# Patient Record
Sex: Female | Born: 1975 | Race: White | Hispanic: No | Marital: Married | Smoking: Current every day smoker
Health system: Southern US, Community
[De-identification: ages and names within clinical notes are randomized; demographics above are authoritative.]

## PROBLEM LIST (undated history)

## (undated) DIAGNOSIS — M542 Cervicalgia: Secondary | ICD-10-CM

## (undated) DIAGNOSIS — G8929 Other chronic pain: Secondary | ICD-10-CM

## (undated) DIAGNOSIS — F319 Bipolar disorder, unspecified: Secondary | ICD-10-CM

## (undated) DIAGNOSIS — F1120 Opioid dependence, uncomplicated: Secondary | ICD-10-CM

## (undated) DIAGNOSIS — M549 Dorsalgia, unspecified: Secondary | ICD-10-CM

## (undated) DIAGNOSIS — O24419 Gestational diabetes mellitus in pregnancy, unspecified control: Secondary | ICD-10-CM

## (undated) HISTORY — PX: TUBAL LIGATION: SHX77

## (undated) HISTORY — PX: OTHER SURGICAL HISTORY: SHX169

## (undated) HISTORY — DX: Dorsalgia, unspecified: M54.9

## (undated) HISTORY — PX: NECK SURGERY: SHX720

## (undated) HISTORY — DX: Opioid dependence, uncomplicated: F11.20

## (undated) HISTORY — DX: Bipolar disorder, unspecified: F31.9

## (undated) HISTORY — DX: Cervicalgia: M54.2

## (undated) HISTORY — DX: Gestational diabetes mellitus in pregnancy, unspecified control: O24.419

## (undated) HISTORY — DX: Other chronic pain: G89.29

---

## 1998-04-29 ENCOUNTER — Emergency Department (HOSPITAL_COMMUNITY): Admission: EM | Admit: 1998-04-29 | Discharge: 1998-04-29 | Payer: Self-pay | Admitting: Emergency Medicine

## 1998-05-19 ENCOUNTER — Other Ambulatory Visit: Admission: RE | Admit: 1998-05-19 | Discharge: 1998-05-19 | Payer: Self-pay | Admitting: Obstetrics

## 1998-06-15 ENCOUNTER — Ambulatory Visit (HOSPITAL_COMMUNITY): Admission: RE | Admit: 1998-06-15 | Discharge: 1998-06-15 | Payer: Self-pay | Admitting: Obstetrics

## 1998-10-13 ENCOUNTER — Inpatient Hospital Stay (HOSPITAL_COMMUNITY): Admission: RE | Admit: 1998-10-13 | Discharge: 1998-10-13 | Payer: Self-pay | Admitting: Obstetrics

## 1999-01-07 ENCOUNTER — Inpatient Hospital Stay (HOSPITAL_COMMUNITY): Admission: AD | Admit: 1999-01-07 | Discharge: 1999-01-09 | Payer: Self-pay | Admitting: Obstetrics

## 1999-02-19 ENCOUNTER — Encounter: Payer: Self-pay | Admitting: Emergency Medicine

## 1999-02-19 ENCOUNTER — Emergency Department (HOSPITAL_COMMUNITY): Admission: EM | Admit: 1999-02-19 | Discharge: 1999-02-19 | Payer: Self-pay | Admitting: Emergency Medicine

## 1999-04-19 ENCOUNTER — Emergency Department (HOSPITAL_COMMUNITY): Admission: EM | Admit: 1999-04-19 | Discharge: 1999-04-19 | Payer: Self-pay | Admitting: Emergency Medicine

## 1999-04-19 ENCOUNTER — Encounter: Payer: Self-pay | Admitting: Emergency Medicine

## 1999-06-02 ENCOUNTER — Encounter: Payer: Self-pay | Admitting: Orthopedic Surgery

## 1999-06-02 ENCOUNTER — Ambulatory Visit (HOSPITAL_COMMUNITY): Admission: RE | Admit: 1999-06-02 | Discharge: 1999-06-02 | Payer: Self-pay | Admitting: Orthopedic Surgery

## 1999-09-15 ENCOUNTER — Emergency Department (HOSPITAL_COMMUNITY): Admission: EM | Admit: 1999-09-15 | Discharge: 1999-09-15 | Payer: Self-pay | Admitting: Emergency Medicine

## 1999-10-07 ENCOUNTER — Encounter: Payer: Self-pay | Admitting: Emergency Medicine

## 1999-10-07 ENCOUNTER — Emergency Department (HOSPITAL_COMMUNITY): Admission: EM | Admit: 1999-10-07 | Discharge: 1999-10-07 | Payer: Self-pay | Admitting: Emergency Medicine

## 1999-12-28 ENCOUNTER — Emergency Department (HOSPITAL_COMMUNITY): Admission: EM | Admit: 1999-12-28 | Discharge: 1999-12-28 | Payer: Self-pay

## 2000-01-19 ENCOUNTER — Emergency Department (HOSPITAL_COMMUNITY): Admission: EM | Admit: 2000-01-19 | Discharge: 2000-01-19 | Payer: Self-pay | Admitting: Emergency Medicine

## 2000-01-22 ENCOUNTER — Emergency Department (HOSPITAL_COMMUNITY): Admission: EM | Admit: 2000-01-22 | Discharge: 2000-01-22 | Payer: Self-pay | Admitting: Emergency Medicine

## 2000-02-19 ENCOUNTER — Emergency Department (HOSPITAL_COMMUNITY): Admission: EM | Admit: 2000-02-19 | Discharge: 2000-02-19 | Payer: Self-pay | Admitting: Emergency Medicine

## 2000-02-28 ENCOUNTER — Emergency Department (HOSPITAL_COMMUNITY): Admission: EM | Admit: 2000-02-28 | Discharge: 2000-02-28 | Payer: Self-pay | Admitting: Emergency Medicine

## 2000-03-14 ENCOUNTER — Encounter: Payer: Self-pay | Admitting: General Practice

## 2000-03-14 ENCOUNTER — Encounter: Admission: RE | Admit: 2000-03-14 | Discharge: 2000-03-14 | Payer: Self-pay | Admitting: General Practice

## 2000-06-17 ENCOUNTER — Emergency Department (HOSPITAL_COMMUNITY): Admission: EM | Admit: 2000-06-17 | Discharge: 2000-06-17 | Payer: Self-pay | Admitting: Emergency Medicine

## 2000-08-01 ENCOUNTER — Other Ambulatory Visit: Admission: RE | Admit: 2000-08-01 | Discharge: 2000-08-01 | Payer: Self-pay | Admitting: Obstetrics

## 2000-08-10 ENCOUNTER — Inpatient Hospital Stay (HOSPITAL_COMMUNITY): Admission: AD | Admit: 2000-08-10 | Discharge: 2000-08-10 | Payer: Self-pay | Admitting: Obstetrics

## 2000-08-16 ENCOUNTER — Observation Stay (HOSPITAL_COMMUNITY): Admission: AD | Admit: 2000-08-16 | Discharge: 2000-08-17 | Payer: Self-pay | Admitting: Obstetrics

## 2000-08-16 ENCOUNTER — Encounter (INDEPENDENT_AMBULATORY_CARE_PROVIDER_SITE_OTHER): Payer: Self-pay | Admitting: Specialist

## 2000-08-17 ENCOUNTER — Encounter: Payer: Self-pay | Admitting: Obstetrics

## 2000-09-05 ENCOUNTER — Emergency Department (HOSPITAL_COMMUNITY): Admission: EM | Admit: 2000-09-05 | Discharge: 2000-09-05 | Payer: Self-pay | Admitting: Emergency Medicine

## 2000-10-30 ENCOUNTER — Encounter: Payer: Self-pay | Admitting: Internal Medicine

## 2000-10-30 ENCOUNTER — Emergency Department (HOSPITAL_COMMUNITY): Admission: EM | Admit: 2000-10-30 | Discharge: 2000-10-30 | Payer: Self-pay | Admitting: Internal Medicine

## 2000-11-18 ENCOUNTER — Emergency Department (HOSPITAL_COMMUNITY): Admission: EM | Admit: 2000-11-18 | Discharge: 2000-11-18 | Payer: Self-pay

## 2000-11-28 ENCOUNTER — Other Ambulatory Visit: Admission: RE | Admit: 2000-11-28 | Discharge: 2000-11-28 | Payer: Self-pay | Admitting: Obstetrics

## 2001-01-27 ENCOUNTER — Emergency Department (HOSPITAL_COMMUNITY): Admission: EM | Admit: 2001-01-27 | Discharge: 2001-01-27 | Payer: Self-pay | Admitting: *Deleted

## 2001-04-30 ENCOUNTER — Inpatient Hospital Stay (HOSPITAL_COMMUNITY): Admission: AD | Admit: 2001-04-30 | Discharge: 2001-04-30 | Payer: Self-pay | Admitting: Obstetrics

## 2001-06-06 ENCOUNTER — Inpatient Hospital Stay (HOSPITAL_COMMUNITY): Admission: AD | Admit: 2001-06-06 | Discharge: 2001-06-06 | Payer: Self-pay | Admitting: Obstetrics

## 2001-07-03 ENCOUNTER — Inpatient Hospital Stay (HOSPITAL_COMMUNITY): Admission: AD | Admit: 2001-07-03 | Discharge: 2001-07-03 | Payer: Self-pay | Admitting: Obstetrics

## 2001-07-09 ENCOUNTER — Inpatient Hospital Stay (HOSPITAL_COMMUNITY): Admission: AD | Admit: 2001-07-09 | Discharge: 2001-07-09 | Payer: Self-pay | Admitting: Obstetrics

## 2001-07-18 ENCOUNTER — Inpatient Hospital Stay (HOSPITAL_COMMUNITY): Admission: AD | Admit: 2001-07-18 | Discharge: 2001-07-18 | Payer: Self-pay | Admitting: Obstetrics

## 2001-07-24 ENCOUNTER — Inpatient Hospital Stay (HOSPITAL_COMMUNITY): Admission: AD | Admit: 2001-07-24 | Discharge: 2001-07-26 | Payer: Self-pay | Admitting: Obstetrics

## 2001-09-17 ENCOUNTER — Encounter: Payer: Self-pay | Admitting: *Deleted

## 2001-09-17 ENCOUNTER — Emergency Department (HOSPITAL_COMMUNITY): Admission: EM | Admit: 2001-09-17 | Discharge: 2001-09-17 | Payer: Self-pay | Admitting: *Deleted

## 2001-10-18 ENCOUNTER — Encounter: Payer: Self-pay | Admitting: Emergency Medicine

## 2001-10-18 ENCOUNTER — Emergency Department (HOSPITAL_COMMUNITY): Admission: EM | Admit: 2001-10-18 | Discharge: 2001-10-18 | Payer: Self-pay | Admitting: Emergency Medicine

## 2001-12-20 ENCOUNTER — Emergency Department (HOSPITAL_COMMUNITY): Admission: EM | Admit: 2001-12-20 | Discharge: 2001-12-20 | Payer: Self-pay | Admitting: Emergency Medicine

## 2002-02-16 ENCOUNTER — Encounter: Payer: Self-pay | Admitting: *Deleted

## 2002-02-16 ENCOUNTER — Emergency Department (HOSPITAL_COMMUNITY): Admission: EM | Admit: 2002-02-16 | Discharge: 2002-02-16 | Payer: Self-pay | Admitting: Emergency Medicine

## 2002-06-02 ENCOUNTER — Encounter: Payer: Self-pay | Admitting: Emergency Medicine

## 2002-06-02 ENCOUNTER — Emergency Department (HOSPITAL_COMMUNITY): Admission: EM | Admit: 2002-06-02 | Discharge: 2002-06-03 | Payer: Self-pay | Admitting: Emergency Medicine

## 2002-07-15 ENCOUNTER — Emergency Department (HOSPITAL_COMMUNITY): Admission: EM | Admit: 2002-07-15 | Discharge: 2002-07-15 | Payer: Self-pay | Admitting: Emergency Medicine

## 2002-07-27 ENCOUNTER — Emergency Department (HOSPITAL_COMMUNITY): Admission: EM | Admit: 2002-07-27 | Discharge: 2002-07-27 | Payer: Self-pay | Admitting: Emergency Medicine

## 2002-08-14 ENCOUNTER — Emergency Department (HOSPITAL_COMMUNITY): Admission: EM | Admit: 2002-08-14 | Discharge: 2002-08-14 | Payer: Self-pay | Admitting: Emergency Medicine

## 2002-08-16 ENCOUNTER — Emergency Department (HOSPITAL_COMMUNITY): Admission: EM | Admit: 2002-08-16 | Discharge: 2002-08-16 | Payer: Self-pay | Admitting: Emergency Medicine

## 2002-08-31 ENCOUNTER — Emergency Department (HOSPITAL_COMMUNITY): Admission: EM | Admit: 2002-08-31 | Discharge: 2002-08-31 | Payer: Self-pay | Admitting: Emergency Medicine

## 2002-08-31 ENCOUNTER — Encounter: Payer: Self-pay | Admitting: Emergency Medicine

## 2002-09-08 ENCOUNTER — Emergency Department (HOSPITAL_COMMUNITY): Admission: EM | Admit: 2002-09-08 | Discharge: 2002-09-09 | Payer: Self-pay | Admitting: Emergency Medicine

## 2002-09-12 ENCOUNTER — Encounter: Payer: Self-pay | Admitting: Emergency Medicine

## 2002-09-12 ENCOUNTER — Emergency Department (HOSPITAL_COMMUNITY): Admission: EM | Admit: 2002-09-12 | Discharge: 2002-09-12 | Payer: Self-pay | Admitting: Emergency Medicine

## 2002-09-13 ENCOUNTER — Emergency Department (HOSPITAL_COMMUNITY): Admission: EM | Admit: 2002-09-13 | Discharge: 2002-09-13 | Payer: Self-pay | Admitting: Emergency Medicine

## 2002-09-13 ENCOUNTER — Encounter: Payer: Self-pay | Admitting: Emergency Medicine

## 2003-01-07 ENCOUNTER — Encounter: Payer: Self-pay | Admitting: Emergency Medicine

## 2003-01-07 ENCOUNTER — Emergency Department (HOSPITAL_COMMUNITY): Admission: EM | Admit: 2003-01-07 | Discharge: 2003-01-07 | Payer: Self-pay | Admitting: Emergency Medicine

## 2003-03-01 ENCOUNTER — Encounter: Admission: RE | Admit: 2003-03-01 | Discharge: 2003-03-01 | Payer: Self-pay | Admitting: Internal Medicine

## 2003-03-01 ENCOUNTER — Encounter: Payer: Self-pay | Admitting: Internal Medicine

## 2003-05-29 ENCOUNTER — Emergency Department (HOSPITAL_COMMUNITY): Admission: EM | Admit: 2003-05-29 | Discharge: 2003-05-29 | Payer: Self-pay | Admitting: Emergency Medicine

## 2003-06-04 ENCOUNTER — Encounter (INDEPENDENT_AMBULATORY_CARE_PROVIDER_SITE_OTHER): Payer: Self-pay | Admitting: *Deleted

## 2003-06-04 ENCOUNTER — Ambulatory Visit (HOSPITAL_COMMUNITY): Admission: RE | Admit: 2003-06-04 | Discharge: 2003-06-04 | Payer: Self-pay | Admitting: General Surgery

## 2003-07-16 ENCOUNTER — Emergency Department (HOSPITAL_COMMUNITY): Admission: EM | Admit: 2003-07-16 | Discharge: 2003-07-17 | Payer: Self-pay | Admitting: Emergency Medicine

## 2003-07-17 ENCOUNTER — Encounter: Payer: Self-pay | Admitting: Emergency Medicine

## 2003-08-25 ENCOUNTER — Emergency Department (HOSPITAL_COMMUNITY): Admission: EM | Admit: 2003-08-25 | Discharge: 2003-08-25 | Payer: Self-pay | Admitting: Emergency Medicine

## 2003-08-25 ENCOUNTER — Encounter: Payer: Self-pay | Admitting: Emergency Medicine

## 2003-10-11 ENCOUNTER — Inpatient Hospital Stay (HOSPITAL_COMMUNITY): Admission: AD | Admit: 2003-10-11 | Discharge: 2003-10-11 | Payer: Self-pay | Admitting: Obstetrics

## 2003-10-17 ENCOUNTER — Encounter (INDEPENDENT_AMBULATORY_CARE_PROVIDER_SITE_OTHER): Payer: Self-pay | Admitting: Specialist

## 2003-10-17 ENCOUNTER — Ambulatory Visit (HOSPITAL_COMMUNITY): Admission: AD | Admit: 2003-10-17 | Discharge: 2003-10-17 | Payer: Self-pay | Admitting: Obstetrics

## 2003-11-21 ENCOUNTER — Emergency Department (HOSPITAL_COMMUNITY): Admission: EM | Admit: 2003-11-21 | Discharge: 2003-11-21 | Payer: Self-pay | Admitting: Emergency Medicine

## 2003-11-23 ENCOUNTER — Emergency Department (HOSPITAL_COMMUNITY): Admission: EM | Admit: 2003-11-23 | Discharge: 2003-11-23 | Payer: Self-pay | Admitting: Emergency Medicine

## 2003-12-10 ENCOUNTER — Emergency Department (HOSPITAL_COMMUNITY): Admission: EM | Admit: 2003-12-10 | Discharge: 2003-12-11 | Payer: Self-pay

## 2004-02-20 ENCOUNTER — Emergency Department (HOSPITAL_COMMUNITY): Admission: EM | Admit: 2004-02-20 | Discharge: 2004-02-20 | Payer: Self-pay | Admitting: Emergency Medicine

## 2004-03-29 ENCOUNTER — Emergency Department (HOSPITAL_COMMUNITY): Admission: EM | Admit: 2004-03-29 | Discharge: 2004-03-29 | Payer: Self-pay | Admitting: Emergency Medicine

## 2004-05-17 ENCOUNTER — Emergency Department (HOSPITAL_COMMUNITY): Admission: EM | Admit: 2004-05-17 | Discharge: 2004-05-17 | Payer: Self-pay | Admitting: Emergency Medicine

## 2004-07-26 ENCOUNTER — Emergency Department (HOSPITAL_COMMUNITY): Admission: EM | Admit: 2004-07-26 | Discharge: 2004-07-26 | Payer: Self-pay | Admitting: Emergency Medicine

## 2004-09-02 ENCOUNTER — Emergency Department (HOSPITAL_COMMUNITY): Admission: EM | Admit: 2004-09-02 | Discharge: 2004-09-02 | Payer: Self-pay | Admitting: Emergency Medicine

## 2004-09-10 ENCOUNTER — Emergency Department (HOSPITAL_COMMUNITY): Admission: EM | Admit: 2004-09-10 | Discharge: 2004-09-10 | Payer: Self-pay | Admitting: Emergency Medicine

## 2004-10-01 ENCOUNTER — Inpatient Hospital Stay (HOSPITAL_COMMUNITY): Admission: AD | Admit: 2004-10-01 | Discharge: 2004-10-01 | Payer: Self-pay | Admitting: Obstetrics

## 2004-10-19 ENCOUNTER — Inpatient Hospital Stay (HOSPITAL_COMMUNITY): Admission: AD | Admit: 2004-10-19 | Discharge: 2004-10-19 | Payer: Self-pay | Admitting: Obstetrics

## 2004-10-22 ENCOUNTER — Inpatient Hospital Stay (HOSPITAL_COMMUNITY): Admission: AD | Admit: 2004-10-22 | Discharge: 2004-10-22 | Payer: Self-pay | Admitting: Obstetrics

## 2005-04-19 ENCOUNTER — Inpatient Hospital Stay (HOSPITAL_COMMUNITY): Admission: AD | Admit: 2005-04-19 | Discharge: 2005-04-19 | Payer: Self-pay | Admitting: Obstetrics

## 2005-04-30 ENCOUNTER — Inpatient Hospital Stay (HOSPITAL_COMMUNITY): Admission: AD | Admit: 2005-04-30 | Discharge: 2005-05-03 | Payer: Self-pay | Admitting: Obstetrics

## 2005-05-21 ENCOUNTER — Inpatient Hospital Stay (HOSPITAL_COMMUNITY): Admission: AD | Admit: 2005-05-21 | Discharge: 2005-05-21 | Payer: Self-pay | Admitting: Obstetrics

## 2005-06-22 ENCOUNTER — Inpatient Hospital Stay (HOSPITAL_COMMUNITY): Admission: AD | Admit: 2005-06-22 | Discharge: 2005-06-24 | Payer: Self-pay | Admitting: Obstetrics

## 2005-08-15 ENCOUNTER — Emergency Department (HOSPITAL_COMMUNITY): Admission: EM | Admit: 2005-08-15 | Discharge: 2005-08-15 | Payer: Self-pay | Admitting: Emergency Medicine

## 2005-08-31 ENCOUNTER — Emergency Department (HOSPITAL_COMMUNITY): Admission: EM | Admit: 2005-08-31 | Discharge: 2005-08-31 | Payer: Self-pay | Admitting: Emergency Medicine

## 2005-11-21 ENCOUNTER — Emergency Department (HOSPITAL_COMMUNITY): Admission: EM | Admit: 2005-11-21 | Discharge: 2005-11-21 | Payer: Self-pay | Admitting: Emergency Medicine

## 2006-02-01 ENCOUNTER — Ambulatory Visit (HOSPITAL_COMMUNITY): Admission: RE | Admit: 2006-02-01 | Discharge: 2006-02-02 | Payer: Self-pay | Admitting: Neurosurgery

## 2006-04-23 ENCOUNTER — Emergency Department (HOSPITAL_COMMUNITY): Admission: EM | Admit: 2006-04-23 | Discharge: 2006-04-23 | Payer: Self-pay | Admitting: Emergency Medicine

## 2006-05-08 ENCOUNTER — Encounter: Admission: RE | Admit: 2006-05-08 | Discharge: 2006-05-08 | Payer: Self-pay | Admitting: Neurosurgery

## 2006-07-09 ENCOUNTER — Emergency Department (HOSPITAL_COMMUNITY): Admission: EM | Admit: 2006-07-09 | Discharge: 2006-07-09 | Payer: Self-pay | Admitting: Emergency Medicine

## 2006-07-21 ENCOUNTER — Emergency Department (HOSPITAL_COMMUNITY): Admission: EM | Admit: 2006-07-21 | Discharge: 2006-07-21 | Payer: Self-pay | Admitting: Emergency Medicine

## 2006-08-12 ENCOUNTER — Encounter: Admission: RE | Admit: 2006-08-12 | Discharge: 2006-08-12 | Payer: Self-pay | Admitting: Neurosurgery

## 2006-09-07 ENCOUNTER — Emergency Department (HOSPITAL_COMMUNITY): Admission: EM | Admit: 2006-09-07 | Discharge: 2006-09-07 | Payer: Self-pay | Admitting: Emergency Medicine

## 2006-09-15 ENCOUNTER — Ambulatory Visit: Payer: Self-pay | Admitting: Physical Medicine & Rehabilitation

## 2006-09-15 ENCOUNTER — Encounter
Admission: RE | Admit: 2006-09-15 | Discharge: 2006-12-14 | Payer: Self-pay | Admitting: Physical Medicine & Rehabilitation

## 2006-09-30 ENCOUNTER — Emergency Department (HOSPITAL_COMMUNITY): Admission: EM | Admit: 2006-09-30 | Discharge: 2006-09-30 | Payer: Self-pay | Admitting: Emergency Medicine

## 2006-10-17 ENCOUNTER — Ambulatory Visit: Payer: Self-pay | Admitting: Physical Medicine & Rehabilitation

## 2006-12-07 ENCOUNTER — Encounter
Admission: RE | Admit: 2006-12-07 | Discharge: 2007-03-07 | Payer: Self-pay | Admitting: Physical Medicine & Rehabilitation

## 2006-12-07 ENCOUNTER — Ambulatory Visit: Payer: Self-pay | Admitting: Physical Medicine & Rehabilitation

## 2007-01-07 ENCOUNTER — Emergency Department (HOSPITAL_COMMUNITY): Admission: EM | Admit: 2007-01-07 | Discharge: 2007-01-07 | Payer: Self-pay | Admitting: Emergency Medicine

## 2007-01-10 ENCOUNTER — Ambulatory Visit (HOSPITAL_COMMUNITY): Admission: RE | Admit: 2007-01-10 | Discharge: 2007-01-10 | Payer: Self-pay | Admitting: Neurosurgery

## 2007-02-07 ENCOUNTER — Inpatient Hospital Stay (HOSPITAL_COMMUNITY): Admission: RE | Admit: 2007-02-07 | Discharge: 2007-02-10 | Payer: Self-pay | Admitting: Neurosurgery

## 2007-07-05 ENCOUNTER — Inpatient Hospital Stay (HOSPITAL_COMMUNITY): Admission: AD | Admit: 2007-07-05 | Discharge: 2007-07-05 | Payer: Self-pay | Admitting: Obstetrics

## 2007-07-11 ENCOUNTER — Encounter (INDEPENDENT_AMBULATORY_CARE_PROVIDER_SITE_OTHER): Payer: Self-pay | Admitting: Obstetrics

## 2007-07-11 ENCOUNTER — Ambulatory Visit (HOSPITAL_COMMUNITY): Admission: AD | Admit: 2007-07-11 | Discharge: 2007-07-11 | Payer: Self-pay | Admitting: Obstetrics

## 2007-08-14 ENCOUNTER — Emergency Department (HOSPITAL_COMMUNITY): Admission: EM | Admit: 2007-08-14 | Discharge: 2007-08-14 | Payer: Self-pay | Admitting: Emergency Medicine

## 2007-09-24 ENCOUNTER — Emergency Department (HOSPITAL_COMMUNITY): Admission: EM | Admit: 2007-09-24 | Discharge: 2007-09-24 | Payer: Self-pay | Admitting: Emergency Medicine

## 2007-09-27 ENCOUNTER — Encounter
Admission: RE | Admit: 2007-09-27 | Discharge: 2007-09-28 | Payer: Self-pay | Admitting: Physical Medicine & Rehabilitation

## 2007-09-27 ENCOUNTER — Ambulatory Visit: Payer: Self-pay | Admitting: Physical Medicine & Rehabilitation

## 2007-10-06 ENCOUNTER — Emergency Department (HOSPITAL_COMMUNITY): Admission: EM | Admit: 2007-10-06 | Discharge: 2007-10-06 | Payer: Self-pay | Admitting: Emergency Medicine

## 2007-11-22 ENCOUNTER — Encounter
Admission: RE | Admit: 2007-11-22 | Discharge: 2007-11-23 | Payer: Self-pay | Admitting: Physical Medicine & Rehabilitation

## 2007-11-22 ENCOUNTER — Ambulatory Visit: Payer: Self-pay | Admitting: Physical Medicine & Rehabilitation

## 2007-12-19 ENCOUNTER — Ambulatory Visit (HOSPITAL_COMMUNITY): Admission: RE | Admit: 2007-12-19 | Discharge: 2007-12-19 | Payer: Self-pay | Admitting: Obstetrics

## 2008-02-20 ENCOUNTER — Encounter
Admission: RE | Admit: 2008-02-20 | Discharge: 2008-02-20 | Payer: Self-pay | Admitting: Physical Medicine & Rehabilitation

## 2008-03-30 ENCOUNTER — Emergency Department (HOSPITAL_COMMUNITY): Admission: EM | Admit: 2008-03-30 | Discharge: 2008-03-30 | Payer: Self-pay | Admitting: Emergency Medicine

## 2008-04-01 ENCOUNTER — Encounter: Admission: RE | Admit: 2008-04-01 | Discharge: 2008-04-01 | Payer: Self-pay | Admitting: General Practice

## 2008-04-27 ENCOUNTER — Emergency Department (HOSPITAL_COMMUNITY): Admission: EM | Admit: 2008-04-27 | Discharge: 2008-04-27 | Payer: Self-pay | Admitting: Emergency Medicine

## 2008-05-20 ENCOUNTER — Encounter
Admission: RE | Admit: 2008-05-20 | Discharge: 2008-05-21 | Payer: Self-pay | Admitting: Physical Medicine & Rehabilitation

## 2008-05-21 ENCOUNTER — Ambulatory Visit: Payer: Self-pay | Admitting: Physical Medicine & Rehabilitation

## 2008-06-28 ENCOUNTER — Emergency Department (HOSPITAL_COMMUNITY): Admission: EM | Admit: 2008-06-28 | Discharge: 2008-06-28 | Payer: Self-pay | Admitting: Emergency Medicine

## 2008-07-19 ENCOUNTER — Emergency Department (HOSPITAL_COMMUNITY): Admission: EM | Admit: 2008-07-19 | Discharge: 2008-07-19 | Payer: Self-pay | Admitting: Emergency Medicine

## 2008-09-18 ENCOUNTER — Encounter
Admission: RE | Admit: 2008-09-18 | Discharge: 2008-10-22 | Payer: Self-pay | Admitting: Physical Medicine & Rehabilitation

## 2008-10-22 ENCOUNTER — Ambulatory Visit: Payer: Self-pay | Admitting: Physical Medicine & Rehabilitation

## 2009-01-01 ENCOUNTER — Encounter
Admission: RE | Admit: 2009-01-01 | Discharge: 2009-01-05 | Payer: Self-pay | Admitting: Physical Medicine & Rehabilitation

## 2009-01-05 ENCOUNTER — Ambulatory Visit: Payer: Self-pay | Admitting: Physical Medicine & Rehabilitation

## 2009-02-07 ENCOUNTER — Emergency Department (HOSPITAL_COMMUNITY): Admission: EM | Admit: 2009-02-07 | Discharge: 2009-02-07 | Payer: Self-pay | Admitting: Emergency Medicine

## 2009-02-10 ENCOUNTER — Other Ambulatory Visit: Payer: Self-pay | Admitting: Emergency Medicine

## 2009-02-10 ENCOUNTER — Inpatient Hospital Stay (HOSPITAL_COMMUNITY): Admission: AD | Admit: 2009-02-10 | Discharge: 2009-02-11 | Payer: Self-pay | Admitting: *Deleted

## 2009-02-10 ENCOUNTER — Ambulatory Visit: Payer: Self-pay | Admitting: *Deleted

## 2009-03-02 ENCOUNTER — Emergency Department (HOSPITAL_COMMUNITY): Admission: EM | Admit: 2009-03-02 | Discharge: 2009-03-02 | Payer: Self-pay | Admitting: Emergency Medicine

## 2009-04-23 ENCOUNTER — Encounter: Payer: Self-pay | Admitting: Internal Medicine

## 2009-04-27 ENCOUNTER — Encounter: Payer: Self-pay | Admitting: Internal Medicine

## 2009-04-27 ENCOUNTER — Ambulatory Visit (HOSPITAL_COMMUNITY): Admission: RE | Admit: 2009-04-27 | Discharge: 2009-04-27 | Payer: Self-pay

## 2009-05-08 ENCOUNTER — Ambulatory Visit (HOSPITAL_COMMUNITY): Admission: RE | Admit: 2009-05-08 | Discharge: 2009-05-08 | Payer: Self-pay | Admitting: Internal Medicine

## 2009-05-08 ENCOUNTER — Encounter: Payer: Self-pay | Admitting: Internal Medicine

## 2009-05-08 ENCOUNTER — Ambulatory Visit: Payer: Self-pay | Admitting: Internal Medicine

## 2009-05-08 DIAGNOSIS — M961 Postlaminectomy syndrome, not elsewhere classified: Secondary | ICD-10-CM | POA: Insufficient documentation

## 2009-05-08 DIAGNOSIS — O99810 Abnormal glucose complicating pregnancy: Secondary | ICD-10-CM

## 2009-05-08 DIAGNOSIS — I495 Sick sinus syndrome: Secondary | ICD-10-CM

## 2009-05-08 DIAGNOSIS — F319 Bipolar disorder, unspecified: Secondary | ICD-10-CM | POA: Insufficient documentation

## 2009-05-08 DIAGNOSIS — R51 Headache: Secondary | ICD-10-CM

## 2009-05-08 DIAGNOSIS — R519 Headache, unspecified: Secondary | ICD-10-CM | POA: Insufficient documentation

## 2009-05-08 DIAGNOSIS — M549 Dorsalgia, unspecified: Secondary | ICD-10-CM | POA: Insufficient documentation

## 2009-05-08 DIAGNOSIS — R011 Cardiac murmur, unspecified: Secondary | ICD-10-CM

## 2009-05-08 DIAGNOSIS — F17200 Nicotine dependence, unspecified, uncomplicated: Secondary | ICD-10-CM | POA: Insufficient documentation

## 2009-05-08 DIAGNOSIS — F1120 Opioid dependence, uncomplicated: Secondary | ICD-10-CM

## 2009-05-08 DIAGNOSIS — M542 Cervicalgia: Secondary | ICD-10-CM

## 2009-05-08 DIAGNOSIS — D239 Other benign neoplasm of skin, unspecified: Secondary | ICD-10-CM | POA: Insufficient documentation

## 2009-05-08 LAB — CONVERTED CEMR LAB
Bilirubin Urine: NEGATIVE
Ketones, ur: NEGATIVE mg/dL
LDL Cholesterol: 59 mg/dL (ref 0–99)
Nitrite: NEGATIVE
RBC / HPF: NONE SEEN (ref <3)
Total CHOL/HDL Ratio: 2.6
Urine Glucose: NEGATIVE mg/dL
Urobilinogen, UA: 0.2 (ref 0.0–1.0)
pH: 5.5 (ref 5.0–8.0)

## 2009-05-18 ENCOUNTER — Encounter: Payer: Self-pay | Admitting: Internal Medicine

## 2009-06-05 ENCOUNTER — Emergency Department (HOSPITAL_COMMUNITY): Admission: EM | Admit: 2009-06-05 | Discharge: 2009-06-05 | Payer: Self-pay | Admitting: Emergency Medicine

## 2009-06-08 ENCOUNTER — Emergency Department (HOSPITAL_COMMUNITY): Admission: EM | Admit: 2009-06-08 | Discharge: 2009-06-08 | Payer: Self-pay | Admitting: Emergency Medicine

## 2010-11-27 ENCOUNTER — Encounter: Payer: Self-pay | Admitting: Obstetrics

## 2010-11-28 ENCOUNTER — Encounter: Payer: Self-pay | Admitting: Physical Medicine & Rehabilitation

## 2010-11-28 ENCOUNTER — Encounter: Payer: Self-pay | Admitting: Neurosurgery

## 2010-11-29 ENCOUNTER — Encounter: Payer: Self-pay | Admitting: Neurosurgery

## 2011-02-16 LAB — COMPREHENSIVE METABOLIC PANEL
BUN: 8 mg/dL (ref 6–23)
CO2: 26 mEq/L (ref 19–32)
Chloride: 107 mEq/L (ref 96–112)
Creatinine, Ser: 0.86 mg/dL (ref 0.4–1.2)
GFR calc Af Amer: >60 mL/min (ref >60)
GFR calc non Af Amer: >60 mL/min (ref >60)
Sodium: 139 mEq/L (ref 135–145)
Total Bilirubin: 0.5 mg/dL (ref 0.3–1.2)
Total Protein: 6.4 g/dL (ref 6.0–8.3)

## 2011-02-16 LAB — DIFFERENTIAL
Basophils Absolute: 0 10*3/uL (ref 0.0–0.1)
Basophils Relative: 0 % (ref 0–1)
Eosinophils Absolute: 0.1 10*3/uL (ref 0.0–0.7)
Monocytes Relative: 6 % (ref 3–12)
Neutrophils Relative %: 65 % (ref 43–77)

## 2011-02-16 LAB — CBC
HCT: 37.1 % (ref 36.0–46.0)
Hemoglobin: 12.7 g/dL (ref 12.0–15.0)
Platelets: 235 10*3/uL (ref 150–400)

## 2011-02-16 LAB — URINALYSIS, ROUTINE W REFLEX MICROSCOPIC
Glucose, UA: NEGATIVE mg/dL
Specific Gravity, Urine: 1.018 (ref 1.005–1.030)
Urobilinogen, UA: 0.2 mg/dL (ref 0.0–1.0)
pH: 7.5 (ref 5.0–8.0)

## 2011-02-16 LAB — RAPID URINE DRUG SCREEN, HOSP PERFORMED
Benzodiazepines: NOT DETECTED
Opiates: NOT DETECTED
Tetrahydrocannabinol: NOT DETECTED

## 2011-02-16 LAB — VALPROIC ACID LEVEL: Valproic Acid Lvl: <1 ug/mL — ABNORMAL LOW (ref 50.0–100.0)

## 2011-03-02 ENCOUNTER — Encounter: Payer: Self-pay | Admitting: Internal Medicine

## 2011-03-22 NOTE — Assessment & Plan Note (Signed)
Ms. Fambrough returns to clinic today for followup evaluation.  I  last saw  in this office May 21, 2008.  She was a no show, September 19, 2008.   The patient reports that she has been getting good relief with the  hydrocodone 10/325 used anywhere from 4 to 6 times per day.  There are  rare days when she has to go up to 7 per day.  She continues to care for  her 5 children at home where she is a single parent.  She does report  that she needs a refill on her hydrocodone at the end of the month, but  has a sufficient supply of Soma at this time.   MEDICATIONS:  1. Soma 350 mg daily p.r.n.  2. Hydrocodone 10/325 one tablet 6 times per day p.r.n. (4-7 per day).   REVIEW OF SYSTEMS:  Noncontributory.   PHYSICAL EXAMINATION:  GENERAL:  Well-appearing, fit adult female in  mild acute discomfort.  VITAL SIGNS:  Blood pressure is 145/91 with the pulse 63, respiratory  rate 18, and O2 saturation 99% on room air.  MUSCULOSKELETAL:  She ambulates without any assistive device.  Cervical  range of motion was decreased in all planes.  Upper extremity strength  was 4+/5 and lower extremity strength was 5-/5.   IMPRESSION:  Chronic neck/posterior scapular and right shoulder pain  after successful cervical fusion C5-C6 x2.   In the office today, we did refill the patient's hydrocodone as of  November 03, 2008.  She reports that she has a sufficient supply of Soma  at this point.  We will plan on seeing her in followup in approximately  3 months' time with refills prior to that appointment as necessary.  She  continues to get good analgesic effect without signs of diversion or  significant side effects.           ______________________________  Ellwood Dense, M.D.     DC/MedQ  D:  10/22/2008 12:15:40  T:  10/23/2008 04:53:47  Job #:  [id number]

## 2011-03-22 NOTE — Assessment & Plan Note (Signed)
[name] returns to clinic today for followup evaluation.  I had seen  her previously, first time being September 18, 2006 and then subsequently  December 08, 2006.  Both of these prior evaluations for persistent neck  and scapular pain after prior cervical fusion C5-C6 and that occurred  February 01, 2006.  When I last saw her in February of this year, we had  been treating her with oxycodone 5 mg 1 to 2 tablets p.o. b.i.d.  She  did not show for an appointment January 05, 2007.   I have received a repeat referral from Dr. Lovell Sheehan for this patient.  I  have minimal medical records that precede the patient, and these  describe CT scan being performed January 10, 2007 which showed satisfactory  fusion at C5-C6 with moderate cervical kyphosis at C4-C5, broad-based  disk protrusion at C4-C5 with small central disk protrusion at C6-C7.   January 17, 2007, the patient saw Dr. Lovell Sheehan in followup.  At that time  there was noted to be a large herniated nucleus pulposa at C5-C6.  They  discussed possible repeat surgeries for pseudoarthrosis.   January 16, 2007, the patient saw Dr. Lovell Sheehan in followup.  He reported  some splaying of the spinous process which reduces on extension, and  could be indicative of a pseudoarthrosis.   February 07, 2007, the patient underwent repeat surgery at C5-C6 for C5-C6  pseudoarthrosis.   February 20, 2007, the patient followed up with Dr. Lovell Sheehan and was now 2  weeks status post C5-C6 instrumentation and fusion with no radicular  symptoms.  At that time she was taking Percocet 10/325 one tablet q.4 h.  p.r.n.   April 13, 2007, the patient saw Dr. Lovell Sheehan at followup.  At that time she  was having a lot of pain that started a few days previously.  Her  diagnosis was cervicalgia, status post C5-C6 anterior and posterior  fusion.  He prescribed a Medrol dose pack along with Lodine 400 mg  b.i.d. p.r.n. and Norco 10/325 one tablet q.5 h. p.r.n.   The patient reports that she  still has persistent pain in her  intrascapular region along with her right posterior shoulder and her  right neck region.  She reports that she does get relief from the  hydrocodone 10/500 used 5 times per day.  She is not taking any further  Lodine at this time, but is taking Soma 350 mg 1 to 2 tablets p.o.  daily.  She has resumed tobacco, approximately 4 cigarettes per day,  after having stopped for the 2nd surgery noted above.  She continues to  care for her children, and is not working, nor is she on disability at  this point.   REVIEW OF SYSTEMS:  Noncontributory.   MEDICATIONS:  1. Soma 350 mg 1 tablet 2 to 3 times per day p.r.n.  2. Lortab 10/500 one tablet 5 times per day p.r.n.   ALLERGIES:  No known drug allergies.   PHYSICAL EXAMINATION:  A well-appearing, fit, adult female in mild acute  discomfort.  Blood pressure and vitals were not obtained in the office today.  She has 4+/5 strength throughout the bilateral upper extremities.  Cervical range of motion was decreased, especially in lateral bending  and rotation, along with flexion.  The patient has a well-healed scar on  her posterior cervical region.  Sensation was intact to light touch  throughout the bilateral upper extremities.  Lower extremity exam showed  normal bulk and  tone throughout.  Strength was 5-/5 throughout the  bilateral lower extremities.  She ambulates without any assistive  device.   IMPRESSION:  Chronic neck/posterior scapular and right shoulder pain  after successful cervical fusion, C5-C6 x2.   At the present time, we had a long discussion regarding the medications  that she is using, the fact that needs to get them only through this  office.  She is getting relief from the hydrocodone, and I have lowered  the dose down to 10/325 instead of 10/500.  She is still allowed to use  up to 5 per day.  We also refilled her Soma as of today, maximum of 2  per day.  She understands that all  medication needs to come through this  office, and that she is not allowed to self administer excessive  amounts.  She understands that she needs to call in for her refills 5  business days prior to running out.  We will plan on seeing her in  followup in approximately 2 months' time.           ______________________________  [name], M.D.     DC/MedQ  D:  09/28/2007 15:01:53  T:  09/29/2007 10:00:21  Job #:  [id number]

## 2011-03-22 NOTE — Assessment & Plan Note (Signed)
HISTORY:  [name] returns to the clinic today for followup evaluation.  I last saw her in this office on November 23, 2007, for treatment of  chronic right shoulder and scapular and neck pain after cervical fusion  at C5-C6 x2.  The patient did not keep an appointment scheduled for  February 25, 2008.  She has been using hydrocodone 10/325 approximately 6  tablets per day.  She does need a refill in the office today.  She also  has been using the Soma approximately 3 times a day and needs a refill.   REVIEW OF SYSTEMS:  Noncontributory.   PHYSICAL EXAMINATION:  GENERAL:  Well-appearing, fit, adult female in  mild acute discomfort.  VITAL SIGNS:  Blood pressure 151/82 with a pulse of 80, respiratory rate  22, and O2 saturation 98% on room air.  MUSCULOSKELETAL:  She has 4+/5 strength throughout the bilateral upper  and lower extremities.  Sensation was intact to light touch throughout.  She ambulates without any assistive device.  Cervical range of motion  was decreased in all planes.   IMPRESSION:  1. Chronic neck/posterior scapular and right shoulder pain after      successful cervical fusion at C5-C6 x2.  2. In the office today, we did send in faxes for the Soma and      hydrocodone for this patient as of today.  She will be taking the      hydrocodone approximately 6 per day and the Soma approximately 3      per day.  She is getting good relief overall and is compliant with      restrictions regarding use.  We will plan on seeing her in followup      in approximately 4 months' time with refills prior to that      appointment if necessary.           ______________________________  Ellwood Dense, M.D.     DC/MedQ  D:  05/21/2008 10:40:58  T:  05/22/2008 04:33:11  Job #:  [id number]

## 2011-03-22 NOTE — Op Note (Signed)
NAMEROLAND, PRINE                  ACCOUNT NO.:  [id number]   MEDICAL RECORD NO.:  [id number]          PATIENT TYPE:  AMB   LOCATION:  [facility]                          FACILITY:  [facility]   PHYSICIAN:  Kathreen Cosier, M.D.DATE OF BIRTH:  20-Jan-1976   DATE OF PROCEDURE:  DATE OF DISCHARGE:  07/11/2007                               OPERATIVE REPORT   PREOPERATIVE DIAGNOSIS:  Spontaneous incomplete abortion.   POSTOPERATIVE DIAGNOSIS:  Spontaneous incomplete abortion.   PROCEDURE PERFORMED:  Dilatation and evacuation.   Using MAC with the patient in the lithotomy position, the perineum and  vagina prepped and draped. Bladder emptied with straight catheter.  Bimanual exam revealed uterus 10-12 weeks size.  Speculum placed in the  vagina.  Cervix injected with 10 mL of 1% Xylocaine.  The os was noted  to be open and easily admitted a #10 suction which was used to aspirate  the uterine contents until the cavity was clean.  The patient tolerated  the procedure well and was taken to the recovery room in good condition.           ______________________________  Kathreen Cosier, M.D.     BAM/MEDQ  D:  07/11/2007  T:  07/11/2007  Job:  [id number]
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
## 2011-03-22 NOTE — Assessment & Plan Note (Signed)
[name] returns to clinic today for followup evaluation.  She reports  that she continues to get good relief from her hydrocodone use 5 times  per day, and her Soma used generally 1 to 2 times per day.  She has,  fortunately, stopped tobacco use completely at this time.  She had been  smoking 1/2 pack of cigarettes per day since age 35.  She has gained a  small amount of weight, but is proud of herself at the present time.  She has a bad cough in the office today.   MEDICATIONS:  1. Soma 350 mg 1 tablet t.i.d. p.r.n.  2. Lortab 10/325 1 tablet 5 times per day p.r.n. (4 to 6 per day).   REVIEW OF SYSTEMS:  Positive for coughing and wheezing.   PHYSICAL EXAMINATION:  A well-appearing, fit, adult female in mild acute  discomfort.  Blood pressure 157/97 with a pulse of 85, respiratory rate 18, and O2  saturation 97% on room air.  The patient has 4+/5 strength throughout the bilateral upper and lower  extremities.  Bulk and tone were normal.  Reflexes were 2+ and  symmetrical.  Sensation was intact to light touch throughout the  bilateral upper extremities.   IMPRESSION:  Chronic neck/posterior scapular and right shoulder pain  after successful cervical fusion C5-C6 x2.   In the office today, we did refill the patient's Soma along with her  hydrocodone as of the 17th of January.  We will plan on seeing her in  followup in this office in approximately 3 months' time with refills  prior to that appointment if necessary.           ______________________________  Ellwood Dense, M.D.     DC/MedQ  D:  11/23/2007 13:17:33  T:  11/23/2007 13:37:26  Job #:  [id number]

## 2011-03-22 NOTE — Discharge Summary (Signed)
[name], [name]                  ACCOUNT NO.:  [id number]   MEDICAL RECORD NO.:  [id number]          PATIENT TYPE:  IPS   LOCATION:  0307                          FACILITY:  [facility]   PHYSICIAN:  Jasmine Pang, M.D. DATE OF BIRTH:  08/06/1976   DATE OF ADMISSION:  02/10/2009  DATE OF DISCHARGE:  02/11/2009                               DISCHARGE SUMMARY   IDENTIFICATION:  This is 35 year old single white female from [place]  who was admitted on a voluntary basis.   HISTORY OF PRESENT ILLNESS:  The patient was admitted for detox.  She  stated she had been using narcotic pain medicines for 13 years.  She has  previously been in pain clinic.  She reports she discharged herself from  these.  She has recently been seeing a psychiatrist at [facility]  and taking Depakote for bipolar disorder.  She is seeking detox for  substance abuse.  She denies any suicidal or homicidal ideation and  there is no psychosis.   PAST PSYCHIATRIC HISTORY:  As indicated above, the patient is seen at  the [facility] as an outpatient.  She is on  Depakote for bipolar disorder.   SUBSTANCE ABUSE:  The patient admits to abusing opiates since 2003.  Yesterday, was her last day of use.  She has been using oxycodone,  hydrocodone, Vicodin, and Percocet.  She apparently gets these off the  street.   MEDICAL PROBLEMS:  Chronic back pain, back surgeries in 2006.   FAMILY HISTORY:  There is a history of abuse of narcotics with mother  and sister.   MEDICATIONS:  Depakote 100 mg b.i.d.   ALLERGIES:  No known drug allergies.   PHYSICAL FINDINGS:  There were no acute physical or medical problems  noted.   HOSPITAL COURSE:  Upon admission, the patient immediately stated she did  not want to stay in the hospital.  She stated she was not suicidal or  homicidal.  She stated she was not interested in continuing her  treatment for substance abuse.  Mood was somewhat irritable before  she  thought she could to go home.  She calmed down when she realized she was  going to discharge.  She had no suicidal or homicidal ideation.  No  thoughts of self-injurious behavior.  No auditory or visual  hallucinations.  No paranoia or delusions.  Thoughts were logical and  goal-directed.  Thought content, no predominant theme.  Cognitive was  grossly intact.  Insight good.  Judgment good.  Impulse control, good.  It was felt the patient was safe for discharge today and she did not  want to stay in the hospital.   DISCHARGE DIAGNOSES:  Axis I: Bipolar disorder, not otherwise specified,  opiate dependence.  Axis II: None.  Axis III: Chronic back and neck pain.  Axis IV: Moderate (problems with primary support group, other  psychosocial issues, burden of psychiatric disorder, and chemical  dependence, burden of medical problems).  Axis V: Global assessment of functioning was 60 at the time of  discharge.  GAF was 40  upon admission.  GAF highest past year 65-70.   DISCHARGE PLANS:  There was no specific activity level or dietary  restriction.   POSTHOSPITAL CARE PLANS:  The patient will go to the [facility] on  February 12, 2009 at 9 a.m. for a walk-in appointment.   DISCHARGE MEDICATIONS:  The patient is to consult with her psychiatrist  and restart the Depakote as prescribed.      Jasmine Pang, M.D.  Electronically Signed     BHS/MEDQ  D:  02/11/2009  T:  02/12/2009  Job:  [id number]

## 2011-03-22 NOTE — Op Note (Signed)
[name], [name]                  ACCOUNT NO.:  [id number]   MEDICAL RECORD NO.:  [id number]          PATIENT TYPE:  AMB   LOCATION:  [facility]                           FACILITY:  [facility]   PHYSICIAN:  Kathreen Cosier, M.D.DATE OF BIRTH:  Jun 27, 1976   DATE OF PROCEDURE:  12/19/2007  DATE OF DISCHARGE:                               OPERATIVE REPORT   PREOPERATIVE DIAGNOSIS:  Multiparity.   POSTOPERATIVE DIAGNOSIS:  Multiparity.   PROCEDURE:  Open laparoscopic tubal sterilization.   Under general anesthesia, the patient in lithotomy position, abdomen,  perineum and vagina prepped and draped, bladder emptied with a straight  catheter.  A speculum placed in the vagina.  Cervix grasped with a Hulka  tenaculum.  In the umbilicus a transverse incision made, carried down to  the fascia.  Fascia cleaned, grasped with two Kochers, fascia and the  peritoneum opened with the Mayo scissors.  Sleeve and the trocar  inserted intraperitoneally, 3 L carbon dioxide infused  intraperitoneally, visualizing scope inserted.  Uterus, tubes, ovaries  normal.  Cautery probe inserted through the sleeve of the scope.  Right  tube grasped 1 inch from the cornu and cauterized, tube cauterized in a  total of four places moving lateral from the first site of cautery.  Procedure done in a similar fashion in the other side.  Probe was  removed from the abdomen and CO2 allowed to escape from the peritoneal  cavity.  Fascia closed with one stitch of 0 Dexon, skin closed with  subcuticular stitch of 4-0 Monocryl.           ______________________________  Kathreen Cosier, M.D.     BAM/MEDQ  D:  12/19/2007  T:  12/20/2007  Job:  [id number]

## 2011-03-25 NOTE — Consult Note (Signed)
[name], [name]                  ACCOUNT NO.:  [id number]   MEDICAL RECORD NO.:  [id number]          PATIENT TYPE:  INP   LOCATION:  9141                          FACILITY:  [facility]   PHYSICIAN:  Althea Grimmer. Santogade, M.D.DATE OF BIRTH:  November 23, 1975   DATE OF CONSULTATION:  05/01/2005  DATE OF DISCHARGE:                                   CONSULTATION   Ms. Marie [name] is a 35 year old female who is 35-weeks pregnant. Presented to  [facility] yesterday with epigastric colicky-type pain which progressed  to diarrhea and with pain descending to the umbilical area. This was acute  in onset over night and the diarrhea initially was grossly bloody. When she  presented to the hospital her white blood count was 14.4 thousand and her  hemoglobin was 12.1. Her BUN was 8. With hydration her white blood count is  now 6.6 thousand, her hemoglobin 9.9, and her BUN 4. She has not had any  fever here in the hospital. Evaluation today has consisted of a negative  abdominal ultrasound, fecal occult blood test that was positive, and amylase  of 79, liver function tests that were normal. Her albumin is 2.2. She denies  any antibiotic exposure within the last few months. She has no prior history  of ulcer disease, colitis or other GI disorders other than having heart burn  with pregnancy for which she is taking Tums. No one at home is ill  currently.   PAST MEDICAL HISTORY:  Only pertinent for carpal tunnel syndrome.   CURRENT MEDICATIONS:  Before admission, prenatal vitamins. In hospital she  is on Pepcid and Flagyl.   ALLERGIES:  None reported.   FAMILY HISTORY:  Noncontributory. No known inflammatory bowel disease in the  family.   SOCIAL HISTORY:  Smokes less than a pack of cigarettes per day. Not using  alcohol.   PHYSICAL EXAMINATION:  GENERAL: She is a well-developed, adult female near  term pregnancy. In no acute distress.  VITAL SIGNS:  Afebrile, blood pressure 131/75, pulse 77 and regular.  HEENT:  Eyes are anicteric. Oropharynx unremarkable.  SKIN:  Normal without rash.  CHEST: Sounds clear.  HEART: Regular rate and rhythm.  ABDOMEN:  Reveals a gravid uterus at 35 weeks. There is mild epigastric  tenderness and moderate right lower quadrant tenderness to deep palpation.  There is no rebound or hernia. Bowel sounds are normal.  RECTAL EXAM:  Not performed.  EXTREMITIES:  Without clubbing, cyanosis, edema, or rash.   IMPRESSION:  With her colicky pain and descending GI symptoms with bloody  diarrhea which is now only yellowish without blood, I suspect that she has  had an acute bacterial gastroenteritis which is spontaneously resolving. One  stool culture is pending.   PLAN:  Since she is improving, I would not institute new therapies at  present. I would observe her, check the stool cultures and repeat a  hemoglobin assessment. Maalox and Reglan will be discontinued as they may be  aggravating her diarrhea. If her reflux worsens, switch a PPI for the  Pepcid, such as using Protonix.  One  of our partners will follow her in the  morning.       PJS/MEDQ  D:  05/01/2005  T:  05/01/2005  Job:  [id number]   cc:   Kathreen Cosier, M.D.  [address]  [place]  [place] [address]  Fax: [phone]   Ginger Carne, MD  Fax: [phone]

## 2011-03-25 NOTE — Discharge Summary (Signed)
[name], [name]                  ACCOUNT NO.:  [id number]   MEDICAL RECORD NO.:  [id number]          PATIENT TYPE:  INP   LOCATION:  3012                         FACILITY:  [facility]   PHYSICIAN:  Cristi Loron, M.D.DATE OF BIRTH:  December 05, 1975   DATE OF ADMISSION:  02/07/2007  DATE OF DISCHARGE:  02/10/2007                               DISCHARGE SUMMARY   BRIEF HISTORY:  The patient is a 35 year old white female who has  undergone a prior C5-6 anterior cervical fusion and plating.  She  developed severe arthrosis and failed medical management.  She was  worked up with a CAT scan, MRA, etc. and it was confirmed that this was  arthrosis.  I discussed the various treatment options with the patient  including a C5-6 posterior cervical fusion and instrumentation.  The  patient weighed the risks, benefits and alternatives to surgery and  decided to proceed with that operation.   For further details of this admission, please refer to typed history and  physical.   HOSPITAL COURSE:  I admitted the patient to [facility] on February 07, 2007.  On the day of admission, I performed a C5-6 posterior  instrumentation with lateral mass screws and rods, as well as a  posterolateral arthrodesis with bone morphogenic protein and vitoss bone  graft extender.  The surgery went well (for full details of this  operation, please refer to typed operative note).   POSTOPERATIVE COURSE:  The patient's postoperative course was  unremarkable.  By postop day number 3, she was afebrile.  Vital signs  stable.  She was eating well, ambulating well and requested discharge to  home.   DISCHARGE MEDICATIONS:  Patient given prescriptions for Roxicodone and  Valium, to be taken p.r.n. for pain and muscle spasms.   The patient was given written discharge instructions.   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   PROCEDURE PERFORMED:  C5-6 posterior cervical fusion and  instrumentation.      Cristi Loron, M.D.  Electronically Signed     JDJ/MEDQ  D:  03/15/2007  T:  03/15/2007  Job:  [id number]

## 2011-03-25 NOTE — Op Note (Signed)
[name], [name]                  ACCOUNT NO.:  [id number]   MEDICAL RECORD NO.:  [id number]          PATIENT TYPE:  INP   LOCATION:  5159                         FACILITY:  [facility]   PHYSICIAN:  Cristi Loron, M.D.DATE OF BIRTH:  1976/08/26   DATE OF PROCEDURE:  02/07/2007  DATE OF DISCHARGE:                               OPERATIVE REPORT   BRIEF HISTORY:  The patient is 35 year old white female who I performed  a C5-6 anterior cervical diskectomy, fusion and plating for large  herniated disk about a year ago.  The patient had good relief of her  radicular symptoms, but she had persistent neck pain.  She failed  medical management and was worked up with x-rays and a CT scan which  demonstrated the patient had developed a pseudoarthrosis at C5-6.  I  discussed various treatment options with the patient including surgery.  The patient has weighed the risks, benefits and alternatives of surgery  and decided to proceed with a C5-6 posterior cervical fusion and  instrumentation.   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   PROCEDURE:  C5-6 posterior cervical fusion; C5-6 posterior cervical  instrumentation (Medtronic titanium lateral mass polyaxial screws and  rods).   SURGEON:  Dr. Delma Officer.   ASSISTANT:  Dr. Hilda Lias.   ANESTHESIA:  General endotracheal   ESTIMATED BLOOD LOSS:  50 mL.   SPECIMENS:  None.   DRAINS:  None.   COMPLICATIONS:  None.   PROCEDURE:  The patient was brought to operating room by the anesthesia  team.  General endotracheal anesthesia was induced.  The Mayfield three-  point head rest was applied to the patient's calvarium.  The patient was  then carefully turned to the prone position on chest rolls.  The  patient's posterior cervical and suboccipital region were then shaved  and prepared with Betadine scrub and Betadine solution.  Sterile drapes  were  applied.  I then injected the area being to be incised with  Marcaine with epinephrine solution and then used the scalpel to make a  linear midline incision over the C5-6 spinous process.  I used  electrocautery to perform a subperiosteal dissection exposing spinous  process and lamina from approximately C4 down to C7.  We then brought in  intraoperative fluoroscopy to confirm our location.  I exposed the  lateral masses at C5-C6.  We used cerebellar retractor for exposure.  We  attempted to use fluoroscopy to place lateral mass screws but we could  not see adequately because of the patient's body habitus despite several  attempts.  We therefore used anatomic landmarks.  We identified the  center of the lateral mass and then used awl to create the pilot hole  and then directed the drill guide laterally and superiorly using  standard trajectories.  We then inserted a 14 mm lateral mass screw at  C5 and C6 bilaterally.  We got good bony purchase.  We then cut  appropriate length rod connected  lateral screws and fastened them in  place using the caps  which we tightened appropriately completing the  instrumentation.   We now turned our attention to arthrodesis.  We used a high-speed drill  to decorticate the remainder of the C5 and C6 facette and then we laid  bone morphogenic protein soaked collagen sponges over these decorticated  posterior lateral structures at C5-6 and Vitoss bone graft extender over  the BMP.  This completed the arthrodesis.  We obtained hemostasis using  bipolar cautery, removed the retractor and then reapproximated the  patient's cervical thoracic fascia with interrupted 0-0 Vicryl suture,  subcutaneous tissue with interrupted 2-0 Vicryl suture and the skin with  a running 2-0 nylon suture.  The wound was then coated with bacitracin  ointment.  A sterile dressing applied.  The drapes were removed.  The  patient was subsequently returned to supine position and the  Mayfield  three-point headrest was removed from calvarium.  The patient was then  extubated by anesthesia team and transported to the post anesthesia care  unit in stable condition.  All sponge, instrument and needle counts  correct at end this case.      Cristi Loron, M.D.  Electronically Signed     JDJ/MEDQ  D:  02/07/2007  T:  02/08/2007  Job:  [id number]

## 2011-03-25 NOTE — Assessment & Plan Note (Signed)
[name] returns to the clinic today for followup evaluation.  I first  and last saw her in this office September 18, 2006 for evaluation of  chronic neck and shoulder pain after a prior diskectomy and fusion March  2007.  She underwent a cervical C5-C6 anterior cervical diskectomy and  fusion with Dr. Lovell Sheehan January 30, 2006.  She had a follow up MRI scan of  her cervical spine done July 2007 that showed cervical fusion at C5-6  without spinal stenosis or foraminal stenosis.  There was reversible of  normal cervical lordosis above the fusion level.  There was minimal disc  bulge at C4-5 with no definite cord signal abnormality.  The patient  subsequently was referred to Dr. Madelon Lips, a local orthopedist, for  evaluation of the shoulder pain.  The patient and Dr. Madelon Lips did not  have a good experience together, according to the patient.  Dr. Madelon Lips  planned to do exploratory surgery after an MRI scan of the shoulder.  We  are not sure the results of that MRI scan but in any event, the patient  decided not to have any exploratory surgery.  When I saw her September 18, 2006, we had started her on oxycodone 5 mg 1 to 2 tablets p.o.  b.i.d. along with Soma b.i.d. She also was given lidocaine patches.   The patient did not come back for her appointment as scheduled secondary  to lack of Medicaid.  She is seen in the office today and is a  combination of both tearful and angry.  She reports that she sent here  for pain relief and she wants pain relief but does not want to use any  medication.  We had tried these medicines but she reports that she is  only getting minimal relief with the oxycodone and that the lidocaine  helps her with her back pain but not with her headache or shoulder pain.  She reports that she does not want any refills on medicines in the  office today.  Instead she wants a follow up MRI scan of her cervical  spine to see what the problem might be.  I have reviewed with her  the  results of the MRI scan done of her cervical spine May 08, 2006, at  [facility].  We will plan on repeating that in the office today.   MEDICATIONS:  1. Soma 350 mg 1 tablet b.i.d. p.r.n.  2. Lidocaine patch p.r.n.  3. Oxycodone 5 mg 1 to 2 tablets p.o. b.i.d. p.r.n.   REVIEW OF SYSTEMS:  Positive for weight gain.   PHYSICAL EXAMINATION:  A well-appearing fit adult female in mild acute  discomfort.  Blood pressure 130/84, the pulse 80, respiratory rate 16 and O2  saturation 97% on room air.  She complains of tenderness of her neck with warm feeling per the  patient reports.  She has 4+/5 strength throughout her bilateral upper extremities.  Cervical range of motion was decreased slightly.   IMPRESSION:  1. Chronic right neck/posterior scapular pain/base of the skull pain      after successful cervical fusion C5-6.  2. No refill on medications were given at the patient's request.      Instead we have set her up for an MRI scan of her cervical spine      with and without contrast at [facility] to be compared with      the May 08, 2006 study.  We will plan on seeing  her in followup in      approximately 1 months' time to review the results of that scan.           ______________________________  Ellwood Dense, M.D.     DC/MedQ  D:  12/08/2006 13:05:54  T:  12/08/2006 14:21:45  Job #:  [id number]

## 2011-03-25 NOTE — Op Note (Signed)
NAMEKIYANI, JERNIGAN                            ACCOUNT NO.:  [id number]   MEDICAL RECORD NO.:  [id number]                   PATIENT TYPE:  AMB   LOCATION:  SDC                                  FACILITY:  [facility]   PHYSICIAN:  Kathreen Cosier, M.D.           DATE OF BIRTH:  04-Mar-1976   DATE OF PROCEDURE:  10/17/2003  DATE OF DISCHARGE:                                 OPERATIVE REPORT   PREOPERATIVE DIAGNOSIS:  Blighted ovum.   PROCEDURE:  D&E.   DESCRIPTION OF PROCEDURE:  Using MAC, the patient in lithotomy position.  Perineum and vagina prepped and draped, bladder emptied with straight  catheter.  Bimanual exam revealed the uterus to be top-normal in size.  Weighted speculum into the vagina.  The cervix was injected at 3, 9, and 12  o'clock with a total of 10 mL of 1% Xylocaine.  Anterior lip of the cervix  grasped with a tenaculum.  The endometrial cavity was entered and sounded to  10 cm.  The cervix was noted to be open and easily admitted a #9 suction.  This was used to aspirate the uterine contents after the cavity was  curetted.  Postop, there was some bleeding from the cervix where the  tenaculum site was, and there was one suture of #1 chromic used for  hemostasis.  The patient tolerated the procedure well and taken to recovery  room in good condition.                                               Kathreen Cosier, M.D.    BAM/MEDQ  D:  10/17/2003  T:  10/18/2003  Job:  [id number]

## 2011-03-25 NOTE — Op Note (Signed)
   [name], [name]                            ACCOUNT NO.:  [id number]   MEDICAL RECORD NO.:  [id number]                   PATIENT TYPE:  OIB   LOCATION:  2899                                 FACILITY:  [facility]   PHYSICIAN:  Leonie Man, M.D.                DATE OF BIRTH:  Feb 15, 1976   DATE OF PROCEDURE:  06/04/2003  DATE OF DISCHARGE:                                 OPERATIVE REPORT   PREOPERATIVE DIAGNOSIS:  Painful exophytic lesion of the right buttock.   POSTOPERATIVE DIAGNOSIS:  Painful exophytic lesion of the right buttock.   OPERATION PERFORMED:  Excision of lesion of the right buttock.   SURGEON:  Leonie Man, M.D.   ASSISTANT:  Nurse.   ANESTHESIA:  General.   INDICATIONS FOR PROCEDURE:  The patient is a 35 year old female presenting  with a purplish-blue, extremely painful lesion of the right buttock, just  overlying the ischial tuberosity.  She comes to the operating room now for  removal after the risks and potential benefits of surgery have been fully  discussed.  All questions answered and consent obtained.   DESCRIPTION OF PROCEDURE:  Following induction of satisfactory general  anesthesia, the patient was positioned in the lithotomy position and the  buttock and perianal regions were prepped and draped to be included in a  sterile operative field.  I infiltrated the region around the lesion with  0.5% Marcaine with epinephrine and made an elliptical incision around the  lesion deepening this through the skin and subcutaneous tissue going deep to  the lesion.  The lesion was removed in its entirety and forwarded for  pathologic evaluation. Hemostasis was obtained with electrocautery.  Subcutaneous tissue was reapproximated with 3-0 Vicryl sutures.  Skin was  closed with running 4-0 Monocryl suture and then reinforced with Dermabond.  I used a sterile Tegaderm dressing over the wound.  Anesthetic was reversed  and the patient removed from the operating  room to the recovery room in  stable condition, having tolerated the procedure well.                                                 Leonie Man, M.D.    PB/MEDQ  D:  06/04/2003  T:  06/04/2003  Job:  [id number]

## 2011-03-25 NOTE — Op Note (Signed)
[name], [name]                  ACCOUNT NO.:  [id number]   MEDICAL RECORD NO.:  [id number]          PATIENT TYPE:  INP   LOCATION:  3019                         FACILITY:  [facility]   PHYSICIAN:  Cristi Loron, M.D.DATE OF BIRTH:  1976/07/06   DATE OF PROCEDURE:  02/01/2006  DATE OF DISCHARGE:  02/02/2006                                 OPERATIVE REPORT   BRIEF HISTORY:  The patient is a 35 year old white female who has suffered  from severe neck pain.  She failed medical management and worked up with a  cervical MI, which demonstrated a large herniated disk at C5-6 with  signifigant spinal cord compression.  I discussed the various treatment  options with the patient including surgery.  The patient has weighed the  risks, benefits, and alternatives of surgery and decided to proceed with a  C5-6 anterior cervical diskectomy and fusion with plating.   PREOPERATIVE DIAGNOSES:  C5-6 herniated nucleus pulposus, spinal stenosis,  cervical myelopathy, degenerative disk disease.   POSTOPERATIVE DIAGNOSES:  C5-6 herniated nucleus pulposus, spinal stenosis,  cervical myelopathy, degenerative disk disease.   PROCEDURE:  C5-6 extensive anterior diskectomy/decompression; interbody  local autograft arthrodesis; insertion of a C5-6 interbody prosthesis  (Alphatec PEEK cage); C5-6 anterior cervical plating utilizing Alphatec  titanium plate and screws.   SURGEON:  Cristi Loron, M.D.   ASSISTANT:  Hewitt Shorts, M.D.   ANESTHESIA:  General endotracheal.   ESTIMATED BLOOD LOSS:  50 mL.   SPECIMENS:  None.   DRAINS:  None.   COMPLICATIONS:  None.   DESCRIPTION OF PROCEDURE:  The patient was taken to the operating room by  the anesthesia team, general endotracheal anesthesia was induced.  The  patient remained in supine position.  A roll was placed under her shoulders  to place her neck in slight extension.  The anterior cervical region was  then prepared with Betadine  scrub and Betadine solution and sterile drapes  were applied.  I then injected the area to be incised with Marcaine with  epinephrine solution and used a scalpel to make a transverse incision in the  patient's left anterior neck.  I used the Metzenbaum scissors to divide the  platysma muscle and then to dissect medial to the sternocleidomastoid,  jugular vein and carotid artery.  I then dissected down toward the anterior  cervical spine, carefully identifying the esophagus and retracting it  medially.  I used the Kitner swabs to clear the soft tissue from the  anterior cervical spine and then inserted a bent spinal needle into the  upper exposed intervertebral disk space.  We then obtained the  intraoperative radiograph to confirm our location.   We then used electrocautery to detach the medial border of the longus colli  muscle bilaterally from the C5-6 intervertebral disk space.  We then  inserted the Caspar self-retaining retractor for exposure and incised the  c5/6 disc with a 15 blade scalpel, we performed a partial diskectomy using  the pituitary forceps and the Karlin curettes.  We then inserted distraction  screws at C5 and C6, distracted  the interspace, and then brought the  operating microscope into the field and under its magnification and  illumination, we completed the decompression.  I used the high-speed drill  to decorticate the vertebral end plates at A3-5 to drill away the remainder  of the C5-6 intervertebral disk, to drill away some posterior spondylosis,  then to thin out the posterior longitudinal ligament.  I then incised the  ligament with the arachnoid knife and then removed it with the Kerrison  punch, undercutting the vertebral end plate.  I encountered a large  herniated disk centrally as expected.  I removed it in multiple fragments  using the pituitary forceps as well as the Kerrison punches.  We then  performed a foraminotomy about the bilateral C6 nerve root  completing the  decompression.   We now turned our attention to the interbody arthrodesis.  We obtained  Alphatec interbody PEEK cages which measured 8 mm in height.  We filled it  with a combination of local autograft bone, which we obtained during the  decompression, as well as Vitoss.  We then filled the cage with this and  then inserted the cage into the distracted C5-6 interspace.  We then removed  the distraction screws.  There was good, snug fit of the interbody  prosthesis.   We now turned our attention to anterior spinal instrumentation.  We obtained  the appropriate-length Alphatec anterior cervical plate.  We used the high-  speed drill to remove some ventral spondylosis so the plate would lay down  flat.  We then laid the plate along the anterior aspect of the vertebral  bodies at C5 and C6.  We drilled two 12 mm holes at C5, two at C6, and then  secured the plates to the vertebral bodies by placing two 12 mm self-tapping  screws at C5 and C6.  We then obtained the intraoperative radiograph that  demonstrated good placement of the plate, screws and interbody graft.  We  also secured the screws in place by locking the locking mechanism.  We then  irrigated the wound out with bacitracin solution and removed the solution,  then obtained stringent hemostasis using bipolar electrocautery.  We then  removed the Caspar self-retaining retractor and inspected the esophagus for  any damage, and there was none apparent.  We then reapproximated the  patient's platysma muscle with interrupted 3-0 Vicryl s suture, the suture  with interrupted 2-0 Vicryl suture, and the skin with Steri-Strips and  Benzoin.  The wound was then covered with bacitracin ointment and a sterile  dressing applied.  The drapes were removed.  The patient was subsequently  extubated by the anesthesia team and transferred to the postanesthesia care unit in stable condition.  All sponge, instrument and needle counts  were  correct at the end of the case.      Cristi Loron, M.D.  Electronically Signed     JDJ/MEDQ  D:  02/01/2006  T:  02/03/2006  Job:  [id number]
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
## 2011-03-25 NOTE — H&P (Signed)
[name], [name]                  ACCOUNT NO.:  [id number]   MEDICAL RECORD NO.:  [id number]          PATIENT TYPE:  MAT   LOCATION:  [facility]                          FACILITY:  [facility]   PHYSICIAN:  Ginger Carne, MD  DATE OF BIRTH:  [birth year]   DATE OF ADMISSION:  04/30/2005  DATE OF DISCHARGE:                                HISTORY & PHYSICAL   CHIEF COMPLAINT:  Epigastric pain with nausea, vomiting, and diarrhea.   HISTORY OF PRESENT ILLNESS:  This is a 35 year old gravida 7, para 4-0-2-0,  Caucasian female, [facility] June 01, 2005, 35-2/[redacted] weeks gestation, admitted to the  maternity admission unit in the early morning of April 30, 2005.  The patient  around midnight developed the onset of nausea, vomiting, diarrhea and  epigastic pain following ingestion of mashed potatoes and green vegetables  for dinner the night prior.  Pain worsened during the night and was brought  in by EMS for evaluation.  The patient, at the time of admission was noted  to have significant upper abdominal pain without evidence for radiation to  her back.  She was having nausea, vomiting, and had been complaining of  diarrhea.  The patient states she has no history in the past of gastritis,  ulcer disease, cholelithiasis.  The patient denies other family members  having same complaints.   OB/GYN HISTORY:  The patient has had 4 full term vaginal deliveries and two  spontaneous first trimester miscarriages.   MEDICAL HISTORY:  Negative.   SURGICAL HISTORY:  Carpal tunnel syndrome repair in the past.   CURRENT MEDICATIONS:  Prenatal vitamins.   ALLERGIES:  None.   SOCIAL HISTORY:  The patient smokes less than 1/2 pack of cigarettes daily.  Denies illicit drug abuse or alcohol abuse.   FAMILY HISTORY:  Negative for first-degree relatives with breast, colon,  ovarian, or uterine carcinoma.   REVIEW OF SYSTEMS:  Negative.   PHYSICAL EXAMINATION:  GENERAL:  Thin-appearing female in distress.  VITAL SIGNS:   Blood pressure is 134/72.  Temperature is 98.6.  HEENT:  Grossly normal.  CHEST:  Clear.  CARDIAC EXAM:  Without murmurs or enlargement.  BREAST EXAM:  Deferred.  EXTREMITY/LYMPHATIC/SKIN/NEUROLOGICAL:  Within normal limits.  ABDOMEN:  No tenderness or rebound of the abdomen.  Full-term pregnancy  palpated.  No evidence for bilateral CVA tenderness.  PELVIC EXAM:  Long, closed, thick cervix.  External fetal monitoring reveals  baseline at 140, variability 5-25 without decelerations.   LABORATORY DATA:  CBC normal.  Gallbladder ultrasound reveals no evidence  for cholelithiasis.  Amylase normal.   IMPRESSION:  Acute gastritis secondary to food poisoning.   PLAN:  The patient continues to have discomfort in the MAU despite  management with Stadol, Reglan, and hydration.  The patient will be admitted  for further hydration, pain relief management, use of Reglan and morphine as  needed and to monitor the patient. Stool cultures for ova and parasites,  infection, C. Difficele, stool for occult blood, H-pylori antibodies and a  PIH panel will be ordered.  Appropriate pain relief will be  prescribed. At this time, the patient does  not have a surgical abdomen, Should symptoms persist  without appreciable improvement, a GI consult will be requested.  Metronidazole, 500 mg. bid will be ordered empirically after stool cultures  are obtained.       SHB/MEDQ  D:  04/30/2005  T:  04/30/2005  Job:  [id number]

## 2011-07-29 LAB — CBC
HCT: 36.3
Platelets: 244
RBC: 4.27
RDW: 15.9 — ABNORMAL HIGH

## 2011-07-29 LAB — PREGNANCY, URINE: Preg Test, Ur: NEGATIVE

## 2011-08-19 LAB — CBC
Hemoglobin: 11.2 — ABNORMAL LOW
RBC: 3.57 — ABNORMAL LOW
RDW: 13.3

## 2011-08-19 LAB — SAMPLE TO BLOOD BANK

## 2011-08-19 LAB — ABO/RH: ABO/RH(D): O NEG

## 2011-08-19 LAB — RH IMMUNE GLOBULIN WORKUP (NOT WOMEN'S HOSP)

## 2012-07-10 ENCOUNTER — Emergency Department (HOSPITAL_COMMUNITY)
Admission: EM | Admit: 2012-07-10 | Discharge: 2012-07-10 | Disposition: A | Discharge status: Home / Self Care | Payer: Medicaid Other | Attending: Emergency Medicine | Admitting: Emergency Medicine

## 2012-07-10 ENCOUNTER — Encounter (HOSPITAL_COMMUNITY): Payer: Self-pay

## 2012-07-10 DIAGNOSIS — M79609 Pain in unspecified limb: Secondary | ICD-10-CM | POA: Insufficient documentation

## 2012-07-10 DIAGNOSIS — F319 Bipolar disorder, unspecified: Secondary | ICD-10-CM | POA: Insufficient documentation

## 2012-07-10 DIAGNOSIS — G8929 Other chronic pain: Secondary | ICD-10-CM | POA: Insufficient documentation

## 2012-07-10 DIAGNOSIS — F17200 Nicotine dependence, unspecified, uncomplicated: Secondary | ICD-10-CM | POA: Insufficient documentation

## 2012-07-10 DIAGNOSIS — F1120 Opioid dependence, uncomplicated: Secondary | ICD-10-CM | POA: Insufficient documentation

## 2012-07-10 DIAGNOSIS — M79641 Pain in right hand: Secondary | ICD-10-CM

## 2012-07-10 MED ORDER — DEXAMETHASONE SODIUM PHOSPHATE 10 MG/ML IJ SOLN
10.0000 mg | Freq: Once | INTRAMUSCULAR | Status: AC
  Administered 2012-07-10: 10 mg via INTRAMUSCULAR
  Filled 2012-07-10: qty 1

## 2012-07-10 MED ORDER — IBUPROFEN 800 MG PO TABS: 800.0000 mg | ORAL_TABLET | Freq: Three times a day (TID) | ORAL | Status: AC

## 2012-07-10 MED ORDER — IBUPROFEN 800 MG PO TABS
800.0000 mg | ORAL_TABLET | Freq: Once | ORAL | Status: AC
  Administered 2012-07-10: 800 mg via ORAL
  Filled 2012-07-10: qty 1

## 2012-07-10 MED ORDER — DEXAMETHASONE 0.75 MG PO TABS: 0.7500 mg | ORAL_TABLET | Freq: Two times a day (BID) | ORAL | Status: AC

## 2012-07-10 NOTE — ED Notes (Signed)
Pt discharged with daughter. 

## 2012-07-10 NOTE — ED Notes (Signed)
Pt presents to ED with right hand pain. Pt states started about three weeks ago. Pt states started in right thumb and moved to the hand. Pt denies any injury to hand.

## 2012-07-10 NOTE — ED Provider Notes (Signed)
Medical screening examination/treatment/procedure(s) were conducted as a shared visit with non-physician practitioner(s) and myself.  I personally evaluated the patient during the encounter  R thumb and thenar eminence pain x 3 weeks.  No weakness, skin change.  +2 radial pulse.  + finkelstein test  Glynn Octave, MD 07/10/12 1705

## 2012-07-10 NOTE — ED Notes (Signed)
Ortho paged for wrist splinting.

## 2012-07-10 NOTE — ED Notes (Signed)
PA at bedside.

## 2012-07-10 NOTE — Progress Notes (Signed)
Orthopedic Tech Progress Note Patient Details:  [name] Feb 23, 1976 [id number]  Ortho Devices Type of Ortho Device: Velcro wrist splint Ortho Device/Splint Location: right wrist splint Ortho Device/Splint Interventions: Application   Cammer, Mickie Bail 07/10/2012, 9:35 AM

## 2012-07-10 NOTE — ED Provider Notes (Signed)
History     CSN: [id number]  Arrival date & time 07/10/12  0751   First MD Initiated Contact with Patient 07/10/12 [phone]      Chief Complaint  Patient presents with  . Hand Pain    (Consider location/radiation/quality/duration/timing/severity/associated sxs/prior treatment) HPI Comments: [name] 36 y.o. female   The chief complaint is: Patient presents with:   Hand Pain   The patient has medical history significant for:   Past Medical History:   Bipolar disorder                                             Back pain, chronic                                           Neck pain, chronic                                           Opioid dependence                                            DM mellitus, gestational                                       Comment:history of gestational DM  Patient presents with right hand pain present for three weeks. Hx signigicant for bilateral carpal tunnel surgey. She complains of swelling and pain with motion. Denies radiation or transmission of pain. Denies numbness. Denies constitutional symptoms.       The history is provided by the patient.    Past Medical History  Diagnosis Date  . Bipolar disorder   . Back pain, chronic   . Neck pain, chronic   . Opioid dependence   . DM mellitus, gestational     history of gestational DM    Past Surgical History  Procedure Date  . Tubal ligation   . Postlaminectomy syndrome cervical region     Family History  Problem Relation Age of Onset  . Cancer Mother 24    living currently  . Cancer Father 89    small cell lung cancer(smoker)  . Bradycardia Sister     2 half sisters with bradycardia    History  Substance Use Topics  . Smoking status: Current Everyday Smoker -- 0.5 packs/day for 18 years  . Smokeless tobacco: Not on file  . Alcohol Use: No    OB History    Grav Para Term Preterm Abortions TAB SAB Ect Mult Living                  Review of Systems    Constitutional: Negative for fever and chills.  Gastrointestinal: Negative for nausea, vomiting and diarrhea.  Musculoskeletal: Positive for arthralgias.  Neurological: Negative for numbness.    Allergies  Review of patient's allergies indicates no known allergies.  Home Medications   Current Outpatient Rx  Name Route Sig Dispense Refill  . ACETAMINOPHEN 325  MG PO TABS Oral Take 650 mg by mouth every 6 (six) hours as needed. For pain    . METHADONE HCL 10 MG/ML PO CONC Oral Take 110 mg by mouth daily.    Marland Kitchen DEXAMETHASONE 0.75 MG PO TABS Oral Take 1 tablet (0.75 mg total) by mouth 2 (two) times daily with a meal. 10 tablet 0  . IBUPROFEN 800 MG PO TABS Oral Take 1 tablet (800 mg total) by mouth 3 (three) times daily. 21 tablet 0    BP 130/84  Pulse 61  Temp 97.7 F (36.5 C) (Oral)  Resp 18  SpO2 95%  Physical Exam  Nursing note and vitals reviewed. Constitutional: She appears well-developed and well-nourished.  HENT:  Head: Normocephalic and atraumatic.  Mouth/Throat: Oropharynx is clear and moist.  Eyes: Conjunctivae and EOM are normal. No scleral icterus.  Cardiovascular: Normal rate, regular rhythm and normal heart sounds.   Pulmonary/Chest: Effort normal and breath sounds normal.  Abdominal: Soft. Bowel sounds are normal. There is no tenderness.  Musculoskeletal: Normal range of motion. She exhibits edema and tenderness.       Right hands appears slightly larger than left.   Patient has tenderness over the thenar eminence.  Neurological: She is alert.  Skin: Skin is warm and dry.    ED Course  Procedures (including critical care time)  Labs Reviewed - No data to display No results found.   1. Right hand pain       MDM  Patient presented for right hand pain that has been present for three weeks. Patient given ibuprofen and decadron for pain and swelling. Patient put in ortho splint and discharged on Ibuprofen. Patient refused narcotics as she is a  recovering addict. Referred to ortho for follow-up. Return precautions given. No red flags for complex regional pain syndrome.         Pixie Casino, PA-C 07/10/12 1025

## 2015-01-02 ENCOUNTER — Emergency Department (HOSPITAL_COMMUNITY): Payer: Medicaid Other

## 2015-01-02 ENCOUNTER — Emergency Department (HOSPITAL_COMMUNITY)
Admission: EM | Admit: 2015-01-02 | Discharge: 2015-01-02 | Discharge status: Against Medical Advice | Payer: Medicaid Other | Attending: Emergency Medicine | Admitting: Emergency Medicine

## 2015-01-02 ENCOUNTER — Encounter (HOSPITAL_COMMUNITY): Payer: Self-pay

## 2015-01-02 DIAGNOSIS — Z79899 Other long term (current) drug therapy: Secondary | ICD-10-CM | POA: Insufficient documentation

## 2015-01-02 DIAGNOSIS — Z720 Tobacco use: Secondary | ICD-10-CM | POA: Insufficient documentation

## 2015-01-02 DIAGNOSIS — R0602 Shortness of breath: Secondary | ICD-10-CM | POA: Insufficient documentation

## 2015-01-02 DIAGNOSIS — R079 Chest pain, unspecified: Secondary | ICD-10-CM | POA: Insufficient documentation

## 2015-01-02 DIAGNOSIS — Z8632 Personal history of gestational diabetes: Secondary | ICD-10-CM | POA: Insufficient documentation

## 2015-01-02 DIAGNOSIS — Z8659 Personal history of other mental and behavioral disorders: Secondary | ICD-10-CM | POA: Insufficient documentation

## 2015-01-02 LAB — CBC
HEMATOCRIT: 38.4 % (ref 36.0–46.0)
Hemoglobin: 13.1 g/dL (ref 12.0–15.0)
MCH: 30.8 pg (ref 26.0–34.0)
MCHC: 34.1 g/dL (ref 30.0–36.0)
MCV: 90.4 fL (ref 78.0–100.0)
Platelets: 204 10*3/uL (ref 150–400)
RBC: 4.25 MIL/uL (ref 3.87–5.11)
RDW: 13 % (ref 11.5–15.5)
WBC: 5.3 10*3/uL (ref 4.0–10.5)

## 2015-01-02 LAB — BASIC METABOLIC PANEL
ANION GAP: 5 (ref 5–15)
BUN: 9 mg/dL (ref 6–23)
CALCIUM: 8.7 mg/dL (ref 8.4–10.5)
CHLORIDE: 105 mmol/L (ref 96–112)
CO2: 26 mmol/L (ref 19–32)
CREATININE: 0.77 mg/dL (ref 0.50–1.10)
GFR calc Af Amer: >90 mL/min (ref >90)
GLUCOSE: 90 mg/dL (ref 70–99)
POTASSIUM: 4 mmol/L (ref 3.5–5.1)
Sodium: 136 mmol/L (ref 135–145)

## 2015-01-02 LAB — I-STAT TROPONIN, ED: TROPONIN I, POC: 0 ng/mL (ref 0.00–0.08)

## 2015-01-02 NOTE — ED Notes (Signed)
Per EMS: Pt at work when she had sudden onset of 8/10 substernal chest pain with radiation to jaw, starting at 0900 AM . Pt given 324 aspirin. Pt now rates pain 2/10. VSS. NAD. NSR.

## 2015-01-02 NOTE — ED Provider Notes (Signed)
CSN: [id number]     Arrival date & time 01/02/15  0737 History   First MD Initiated Contact with Patient 01/02/15 1008     Chief Complaint  Patient presents with  . Chest Pain     (Consider location/radiation/quality/duration/timing/severity/associated sxs/prior Treatment) HPI Comments: Pt states that the symptoms resolved after the aspirin. Has had cp before but not usually this intense  Patient is a 39 y.o. female presenting with chest pain. The history is provided by the patient. No language interpreter was used.  Chest Pain Pain location:  Substernal area Pain quality: crushing   Pain radiates to:  R jaw Pain radiates to the back: no   Pain severity:  Severe Onset quality:  Sudden Duration:  20 minutes Progression:  Resolved Chronicity:  New Relieved by:  Aspirin Associated symptoms: shortness of breath   Associated symptoms: no cough, no diaphoresis, no fever, no nausea, not vomiting and no weakness     Past Medical History  Diagnosis Date  . Bipolar disorder   . Back pain, chronic   . Neck pain, chronic   . Opioid dependence   . DM mellitus, gestational     history of gestational DM   Past Surgical History  Procedure Laterality Date  . Tubal ligation    . Postlaminectomy syndrome cervical region     Family History  Problem Relation Age of Onset  . Cancer Mother 73    living currently  . Cancer Father 46    small cell lung cancer(smoker)  . Bradycardia Sister     2 half sisters with bradycardia   History  Substance Use Topics  . Smoking status: Current Every Day Smoker -- 0.50 packs/day for 18 years  . Smokeless tobacco: Not on file  . Alcohol Use: No   OB History    No data available     Review of Systems  Constitutional: Negative for fever and diaphoresis.  Respiratory: Positive for shortness of breath. Negative for cough.   Cardiovascular: Positive for chest pain.  Gastrointestinal: Negative for nausea and vomiting.  Neurological: Negative for  weakness.  All other systems reviewed and are negative.     Allergies  Review of patient's allergies indicates no known allergies.  Home Medications   Prior to Admission medications   Medication Sig Start Date End Date Taking? Authorizing Provider  acetaminophen (TYLENOL) 325 MG tablet Take 650 mg by mouth every 6 (six) hours as needed. For pain    Historical Provider, MD  methadone (DOLOPHINE) 10 MG/ML solution Take 110 mg by mouth daily.    Historical Provider, MD   BP 144/80 mmHg  Pulse 49  Temp(Src) 98.4 F (36.9 C) (Oral)  Resp 18  Ht 5\' 6"  (1.676 m)  Wt 200 lb (90.719 kg)  BMI 32.30 kg/m2  SpO2 98%  LMP 01/02/2015 (Exact Date) Physical Exam  Constitutional: She is oriented to person, place, and time. She appears well-developed and well-nourished.  Cardiovascular: Normal rate and regular rhythm.   Pulmonary/Chest: Effort normal and breath sounds normal. She exhibits no tenderness.  Abdominal: Soft. Bowel sounds are normal. There is no tenderness.  Musculoskeletal: Normal range of motion.  Neurological: She is alert and oriented to person, place, and time.  Skin: Skin is warm and dry.  Psychiatric: She has a normal mood and affect.  Nursing note and vitals reviewed.   ED Course  Procedures (including critical care time) Labs Review Labs Reviewed  [place], ED  Imaging Review Dg Chest 2 View  01/02/2015   CLINICAL DATA:  One day history of chest pain  EXAM: CHEST  2 VIEW  COMPARISON:  Chest radiograph August 15, 2005; chest CT August 15, 2005  FINDINGS: Lungs are clear. Heart size and pulmonary vascularity are normal. No adenopathy. No pneumothorax. There is postoperative change in the lower cervical spine. There is thoracic lordosis.  IMPRESSION: No edema or consolidation.   Electronically Signed   By: Lowella Grip III M.D.   On: 01/02/2015 10:21     EKG Interpretation   Date/Time:  Friday January 02 2015 10:01:17  EST Ventricular Rate:  49 PR Interval:  146 QRS Duration: 136 QT Interval:  495 QTC Calculation: 447 R Axis:   56 Text Interpretation:  Sinus bradycardia Atrial premature complex IVCD,  consider atypical RBBB Baseline wander in lead(s) V3 Sinus bradycardia T  wave abnormality Non-specific intra-ventricular conduction delay Abnormal  ekg Confirmed by Carmin Muskrat  MD ([id number]) on 01/02/2015 10:30:13 AM      MDM   Final diagnoses:  Chest pain, unspecified chest pain type    Pt left without having second trop. Discussed with pt that we would like to see if there was a change. Pt not wanting to stay. Discussed return precautions    Glendell Docker, NP 01/02/15 [place], MD 01/02/15 [phone]

## 2015-01-02 NOTE — ED Notes (Signed)
Pt called out and stated that she has to leave to pick up her child. ED NP made aware and she states she does not feel comfortable with d/c pt until a 3 hour troponin was drawn and therefore pt will be signing out AMA. Pt made aware of risks and benefits of leaving AMA. Verbalizes understanding and states she still must leave. Pt A&Ox4, VSS, and she is in  NAD.

## 2016-02-16 ENCOUNTER — Encounter: Payer: Self-pay | Admitting: Physical Medicine & Rehabilitation

## 2016-04-02 ENCOUNTER — Emergency Department (HOSPITAL_COMMUNITY): Payer: Self-pay

## 2016-04-02 ENCOUNTER — Encounter (HOSPITAL_COMMUNITY): Payer: Self-pay | Admitting: *Deleted

## 2016-04-02 ENCOUNTER — Emergency Department (HOSPITAL_COMMUNITY)
Admission: EM | Admit: 2016-04-02 | Discharge: 2016-04-02 | Disposition: A | Discharge status: Home / Self Care | Payer: Self-pay | Attending: Emergency Medicine | Admitting: Emergency Medicine

## 2016-04-02 DIAGNOSIS — S60222A Contusion of left hand, initial encounter: Secondary | ICD-10-CM | POA: Insufficient documentation

## 2016-04-02 DIAGNOSIS — F319 Bipolar disorder, unspecified: Secondary | ICD-10-CM | POA: Insufficient documentation

## 2016-04-02 DIAGNOSIS — Y929 Unspecified place or not applicable: Secondary | ICD-10-CM | POA: Insufficient documentation

## 2016-04-02 DIAGNOSIS — Y999 Unspecified external cause status: Secondary | ICD-10-CM | POA: Insufficient documentation

## 2016-04-02 DIAGNOSIS — W231XXA Caught, crushed, jammed, or pinched between stationary objects, initial encounter: Secondary | ICD-10-CM | POA: Insufficient documentation

## 2016-04-02 DIAGNOSIS — F17210 Nicotine dependence, cigarettes, uncomplicated: Secondary | ICD-10-CM | POA: Insufficient documentation

## 2016-04-02 DIAGNOSIS — Y939 Activity, unspecified: Secondary | ICD-10-CM | POA: Insufficient documentation

## 2016-04-02 MED ORDER — HYDROCODONE-ACETAMINOPHEN 5-325 MG PO TABS
2.0000 | ORAL_TABLET | Freq: Once | ORAL | Status: AC
  Administered 2016-04-02: 2 via ORAL
  Filled 2016-04-02: qty 2

## 2016-04-02 NOTE — ED Notes (Signed)
Declined W/C at D/C and was escorted to lobby by RN. 

## 2016-04-02 NOTE — ED Notes (Signed)
Sates she mashed her right hand in a trunk

## 2016-04-02 NOTE — ED Provider Notes (Signed)
CSN: [id number]     Arrival date & time 04/02/16  1112 History  By signing my name below, I, Irene Pap, attest that this documentation has been prepared under the direction and in the presence of Alyse Low, [place]. Electronically Signed: Irene Pap, ED Scribe. 04/02/2016. 12:13 PM.   Chief Complaint  Patient presents with  . Hand Injury   The history is provided by the patient. No language interpreter was used.  HPI Comments: [name] is a 40 y.o. female with a hx of opioid dependence who presents to the Emergency Department complaining of right hand injury onset 3 hours ago. Pt reports that she got her hand smashed in a car trunk. She reports associated swelling, numbness, and worsening pain with movement. She has taken Aleve for pain to no relief. She denies weakness. Pt denies allergies to medications.   Past Medical History  Diagnosis Date  . Bipolar disorder ([place])   . Back pain, chronic   . Neck pain, chronic   . Opioid dependence ([place])   . DM mellitus, gestational     history of gestational DM   Past Surgical History  Procedure Laterality Date  . Tubal ligation    . Postlaminectomy syndrome cervical region     Family History  Problem Relation Age of Onset  . Cancer Mother 30    living currently  . Cancer Father 64    small cell lung cancer(smoker)  . Bradycardia Sister     2 half sisters with bradycardia   Social History  Substance Use Topics  . Smoking status: Current Every Day Smoker -- 0.50 packs/day for 18 years  . Smokeless tobacco: None  . Alcohol Use: Yes   OB History    No data available     Review of Systems  Musculoskeletal: Positive for joint swelling and arthralgias.  Neurological: Positive for numbness. Negative for weakness.  All other systems reviewed and are negative.     Allergies  Review of patient's allergies indicates no known allergies.  Home Medications   Prior to Admission medications   Medication Sig Start Date End  Date Taking? Authorizing Provider  acetaminophen (TYLENOL) 325 MG tablet Take 650 mg by mouth every 6 (six) hours as needed. For pain    Historical Provider, MD  methadone (DOLOPHINE) 10 MG/ML solution Take 110 mg by mouth daily.    Historical Provider, MD   BP 135/92 mmHg  Pulse 77  Temp(Src) 98.2 F (36.8 C) (Oral)  Resp 14  SpO2 97% Physical Exam  Constitutional: She is oriented to person, place, and time. She appears well-developed and well-nourished. No distress.  HENT:  Head: Normocephalic and atraumatic.  Mouth/Throat: Oropharynx is clear and moist. No oropharyngeal exudate.  Eyes: Conjunctivae and EOM are normal. Pupils are equal, round, and reactive to light.  Neck: Normal range of motion. Neck supple.  Musculoskeletal: She exhibits tenderness.  Left hand: Swollen thenar prominence; decreased ROM; NVI  Neurological: She is alert and oriented to person, place, and time.  Skin: Skin is warm and dry.  Psychiatric: She has a normal mood and affect. Her behavior is normal.    ED Course  Procedures (including critical care time) DIAGNOSTIC STUDIES: Oxygen Saturation is 97% on RA, normal by my interpretation.    COORDINATION OF CARE: 12:12 PM-Discussed treatment plan which includes x-ray with pt at bedside and pt agreed to plan.    Labs Review Labs Reviewed - No data to display  Imaging Review Dg Hand Complete  Left  04/02/2016  CLINICAL DATA:  40 year old female with injury to the left hand after smashing it in a heavy car door. Swelling in the entire left hand. Medial hand pain. EXAM: LEFT HAND - COMPLETE 3+ VIEW COMPARISON:  Left fifth finger radiograph 05/05/2008. FINDINGS: There is no evidence of fracture or dislocation. There is no evidence of arthropathy or other focal bone abnormality. Soft tissues are unremarkable. IMPRESSION: Negative. Electronically Signed   By: Vinnie Langton M.D.   On: 04/02/2016 12:29   I have personally reviewed and evaluated these images and  lab results as part of my medical decision-making.   EKG Interpretation None      MDM   Final diagnoses:  Contusion of left hand, initial encounter    Pt placed in an ace wrap. Ice Follow up with Orthopaedist if pain persist An After Visit Summary was printed and given to the patient.  Hollace Kinnier [place], PA-C 04/02/16 [place], MD 04/03/16 [phone]

## 2016-04-02 NOTE — Discharge Instructions (Signed)

## 2016-08-08 ENCOUNTER — Emergency Department (HOSPITAL_COMMUNITY): Payer: Self-pay

## 2016-08-08 ENCOUNTER — Encounter (HOSPITAL_COMMUNITY): Payer: Self-pay

## 2016-08-08 ENCOUNTER — Emergency Department (HOSPITAL_COMMUNITY)
Admission: EM | Admit: 2016-08-08 | Discharge: 2016-08-08 | Disposition: A | Discharge status: Home / Self Care | Payer: Self-pay | Attending: Emergency Medicine | Admitting: Emergency Medicine

## 2016-08-08 DIAGNOSIS — R059 Cough, unspecified: Secondary | ICD-10-CM

## 2016-08-08 DIAGNOSIS — M546 Pain in thoracic spine: Secondary | ICD-10-CM | POA: Insufficient documentation

## 2016-08-08 DIAGNOSIS — R05 Cough: Secondary | ICD-10-CM | POA: Insufficient documentation

## 2016-08-08 DIAGNOSIS — F17200 Nicotine dependence, unspecified, uncomplicated: Secondary | ICD-10-CM | POA: Insufficient documentation

## 2016-08-08 DIAGNOSIS — E119 Type 2 diabetes mellitus without complications: Secondary | ICD-10-CM | POA: Insufficient documentation

## 2016-08-08 LAB — I-STAT CHEM 8, ED
BUN: 15 mg/dL (ref 6–20)
Calcium, Ion: 1.24 mmol/L (ref 1.15–1.40)
Chloride: 102 mmol/L (ref 101–111)
Creatinine, Ser: 0.8 mg/dL (ref 0.44–1.00)
Glucose, Bld: 93 mg/dL (ref 65–99)
HEMATOCRIT: 44 % (ref 36.0–46.0)
HEMOGLOBIN: 15 g/dL (ref 12.0–15.0)
POTASSIUM: 4.2 mmol/L (ref 3.5–5.1)
SODIUM: 136 mmol/L (ref 135–145)
TCO2: 23 mmol/L (ref 0–100)

## 2016-08-08 LAB — D-DIMER, QUANTITATIVE (NOT AT ARMC): D DIMER QUANT: 0.27 ug{FEU}/mL (ref 0.00–0.50)

## 2016-08-08 MED ORDER — KETOROLAC TROMETHAMINE 30 MG/ML IJ SOLN
30.0000 mg | Freq: Once | INTRAMUSCULAR | Status: AC
  Administered 2016-08-08: 30 mg via INTRAVENOUS
  Filled 2016-08-08: qty 1

## 2016-08-08 MED ORDER — CYCLOBENZAPRINE HCL 10 MG PO TABS: 10.0000 mg | ORAL_TABLET | Freq: Three times a day (TID) | ORAL | 0 refills | Status: DC | PRN

## 2016-08-08 NOTE — ED Triage Notes (Signed)
Per Pt, Pt is coming from home with complaints of right sided mid back pain that started two weeks ago. Pt reports the pain has now radiated to her left mid back. Worsens with deep breathing. Denies any recent travel, birth control, or long periods of time sitting.

## 2016-08-08 NOTE — ED Provider Notes (Signed)
[facility] DEPT Provider Note   CSN: [id number] Arrival date & time: 08/08/16  0827     History   Chief Complaint Chief Complaint  Patient presents with  . Cough    HPI [name] is a 40 y.o. female.  HPI  40 year old female presents with back pain for 2 weeks. Started on her right thoracic back and now is bilateral. Worse with inspiration. Also worse with laying on it and letting certain ways. A few days after the back pain started she developed a dry cough. Cough is been intermittent. There is no chest pain or abdominal pain. Has tried ibuprofen without relief. No shortness of breath. No leg swelling, leg pain, or estrogen use. No hemoptysis. Pain is currently an 8/10. Pain got worse over the last 24 hours so she came to the ER. No urinary symptoms  Past Medical History:  Diagnosis Date  . Back pain, chronic   . Bipolar disorder ([place])   . DM mellitus, gestational    history of gestational DM  . Neck pain, chronic   . Opioid dependence [facility])     Patient Active Problem List   Diagnosis Date Noted  . NEVUS 05/08/2009  . BIPOLAR DISORDER UNSPECIFIED 05/08/2009  . OPIOID TYPE DEPENDENCE CONTINUOUS ABUSE 05/08/2009  . TOBACCO USE 05/08/2009  . SINUS BRADYCARDIA 05/08/2009  . DIABETES MELLITUS-GESTATIONAL-CONTROLLED 05/08/2009  . 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 NECK PAIN, CHRONIC 05/08/2009  . BACK PAIN, CHRONIC 05/08/2009  . HEADACHE, CHRONIC 05/08/2009  . HEART MURMUR, BENIGN 05/08/2009    Past Surgical History:  Procedure Laterality Date  . Postlaminectomy syndrome cervical region    . TUBAL LIGATION      OB History    No data available       Home Medications    Prior to Admission medications   Medication Sig Start Date End Date Taking? Authorizing Provider  acetaminophen (TYLENOL) 325 MG tablet Take 650 mg by mouth every 6 (six) hours as needed. For pain    Historical Provider, MD  cyclobenzaprine (FLEXERIL) 10 MG tablet Take  1 tablet (10 mg total) by mouth 3 (three) times daily as needed for muscle spasms. 08/08/16   Sherwood Gambler, MD  methadone (DOLOPHINE) 10 MG/ML solution Take 110 mg by mouth daily.    Historical Provider, MD    Family History Family History  Problem Relation Age of Onset  . Cancer Mother 76    living currently  . Cancer Father 13    small cell lung cancer(smoker)  . Bradycardia Sister     2 half sisters with bradycardia    Social History Social History  Substance Use Topics  . Smoking status: Current Every Day Smoker    Packs/day: 0.50    Years: 18.00  . Smokeless tobacco: Never Used  . Alcohol use Yes     Allergies   Review of patient's allergies indicates no known allergies.   Review of Systems Review of Systems  Constitutional: Negative for fever.  Respiratory: Positive for cough. Negative for shortness of breath.   Cardiovascular: Negative for chest pain and leg swelling.  Gastrointestinal: Negative for abdominal pain.  Musculoskeletal: Positive for back pain.  All other systems reviewed and are negative.    Physical Exam Updated Vital Signs BP 133/92   Pulse 66   Temp 98.4 F (36.9 C) (Oral)   Resp 20   Ht 5\' 7"  (1.702 m)   Wt 216 lb (98 kg)   LMP 07/18/2016  SpO2 100%   BMI 33.83 kg/m   Physical Exam  Constitutional: She is oriented to person, place, and time. She appears well-developed and well-nourished. No distress.  HENT:  Head: Normocephalic and atraumatic.  Right Ear: External ear normal.  Left Ear: External ear normal.  Nose: Nose normal.  Eyes: Right eye exhibits no discharge. Left eye exhibits no discharge.  Cardiovascular: Normal rate, regular rhythm and normal heart sounds.   Pulses:      Radial pulses are 2+ on the right side, and 2+ on the left side.  Pulmonary/Chest: Effort normal and breath sounds normal.  Abdominal: Soft. There is no tenderness.  Musculoskeletal:       Thoracic back: She exhibits tenderness (mild). She  exhibits no bony tenderness.       Back:  Neurological: She is alert and oriented to person, place, and time.  Skin: Skin is warm and dry. She is not diaphoretic.  Nursing note and vitals reviewed.    ED Treatments / Results  Labs (all labs ordered are listed, but only abnormal results are displayed) Labs Reviewed  D-DIMER, QUANTITATIVE (NOT AT [facility])  I-STAT CHEM 8, ED    EKG  EKG Interpretation None       Radiology Dg Chest 2 View  Result Date: 08/08/2016 CLINICAL DATA:  Chest and back pain for approximately 2 weeks EXAM: CHEST  2 VIEW COMPARISON:  January 02, 2015 FINDINGS: There is no edema or consolidation. Heart size and pulmonary vascularity are normal. No adenopathy. There is postoperative change in the cervical spine. There is mid thoracic dextroscoliosis with mid thoracic lordosis as well. IMPRESSION: No edema or consolidation. Electronically Signed   By: Lowella Grip III M.D.   On: 08/08/2016 09:34    Procedures Procedures (including critical care time)  Medications Ordered in ED Medications  ketorolac (TORADOL) 30 MG/ML injection 30 mg (30 mg Intravenous Given 08/08/16 1019)     Initial Impression / Assessment and Plan / ED Course  I have reviewed the triage vital signs and the nursing notes.  Pertinent labs & imaging results that were available during my care of the patient were reviewed by me and considered in my medical decision making (see chart for details).  Clinical Course  Comment By Time  Her pain is probably muscular. Lungs sound clear. Will get CXR, also ddimer for low risk PE given pain started first. Toradol for pain. Sherwood Gambler, MD 10/02 [phone]  Chest x-ray and labs are unremarkable. I think this is likely musculoskeletal. Will add on muscle relaxers and follow-up with PCP. Sherwood Gambler, MD 10/02 1124    Final Clinical Impressions(s) / ED Diagnoses   Final diagnoses:  Acute bilateral thoracic back pain  Cough    New  Prescriptions Discharge Medication List as of 08/08/2016 11:24 AM    START taking these medications   Details  cyclobenzaprine (FLEXERIL) 10 MG tablet Take 1 tablet (10 mg total) by mouth 3 (three) times daily as needed for muscle spasms., Starting Mon 08/08/2016, Print         Sherwood Gambler, MD 08/08/16 1409

## 2016-09-20 ENCOUNTER — Emergency Department (HOSPITAL_COMMUNITY)
Admission: EM | Admit: 2016-09-20 | Discharge: 2016-09-20 | Disposition: A | Discharge status: Home / Self Care | Payer: Medicaid Other | Attending: Emergency Medicine | Admitting: Emergency Medicine

## 2016-09-20 ENCOUNTER — Encounter (HOSPITAL_COMMUNITY): Payer: Self-pay

## 2016-09-20 DIAGNOSIS — R030 Elevated blood-pressure reading, without diagnosis of hypertension: Secondary | ICD-10-CM | POA: Insufficient documentation

## 2016-09-20 DIAGNOSIS — F17200 Nicotine dependence, unspecified, uncomplicated: Secondary | ICD-10-CM | POA: Insufficient documentation

## 2016-09-20 DIAGNOSIS — X500XXA Overexertion from strenuous movement or load, initial encounter: Secondary | ICD-10-CM | POA: Insufficient documentation

## 2016-09-20 DIAGNOSIS — M7918 Myalgia, other site: Secondary | ICD-10-CM

## 2016-09-20 DIAGNOSIS — M546 Pain in thoracic spine: Secondary | ICD-10-CM | POA: Insufficient documentation

## 2016-09-20 DIAGNOSIS — Y9289 Other specified places as the place of occurrence of the external cause: Secondary | ICD-10-CM | POA: Insufficient documentation

## 2016-09-20 DIAGNOSIS — Y990 Civilian activity done for income or pay: Secondary | ICD-10-CM | POA: Insufficient documentation

## 2016-09-20 DIAGNOSIS — Y9389 Activity, other specified: Secondary | ICD-10-CM | POA: Insufficient documentation

## 2016-09-20 MED ORDER — METHOCARBAMOL 500 MG PO TABS
500.0000 mg | ORAL_TABLET | Freq: Once | ORAL | Status: AC
  Administered 2016-09-20: 500 mg via ORAL
  Filled 2016-09-20: qty 1

## 2016-09-20 MED ORDER — KETOROLAC TROMETHAMINE 60 MG/2ML IM SOLN
30.0000 mg | Freq: Once | INTRAMUSCULAR | Status: AC
  Administered 2016-09-20: 30 mg via INTRAMUSCULAR
  Filled 2016-09-20: qty 2

## 2016-09-20 MED ORDER — METHOCARBAMOL 500 MG PO TABS: ORAL_TABLET | ORAL | 0 refills | Status: DC

## 2016-09-20 MED ORDER — TRIAMCINOLONE ACETONIDE 40 MG/ML IJ SUSP
40.0000 mg | Freq: Once | INTRAMUSCULAR | Status: AC
  Administered 2016-09-20: 40 mg
  Filled 2016-09-20: qty 1

## 2016-09-20 MED ORDER — BUPIVACAINE HCL 0.5 % IJ SOLN
50.0000 mL | Freq: Once | INTRAMUSCULAR | Status: AC
  Administered 2016-09-20: 1 mL
  Filled 2016-09-20: qty 50

## 2016-09-20 NOTE — ED Notes (Signed)
ED Provider at bedside. 

## 2016-09-20 NOTE — Discharge Planning (Signed)
Ranjit Ashurst J. Clydene Laming, RN, BSN, [facility] set up appointment with Cammie Sickle, NP on 11/7 @0930 .  Spoke with pt at bedside and provided brochure with directions and phone number highlighted.  Pt verbalizes understanding of keeping appointment.

## 2016-09-20 NOTE — ED Provider Notes (Signed)
[facility] DEPT Provider Note   CSN: [id number] Arrival date & time: 09/20/16  0751     History   Chief Complaint Chief Complaint  Patient presents with  . Back Pain   HPI  Blood pressure (!) 170/113, pulse 70, temperature 97.7 F (36.5 C), temperature source Oral, resp. rate 16, last menstrual period 09/14/2016, SpO2 99 %.  [name] is a 40 y.o. female complaining of severe left thoracic back pain worsening over the course of several weeks, patient had similar episode a few months ago, was worked up for PE which was negative. Patient states that she lifts regularly at work. She states that the pain is exacerbated by movement, palpation, deep breathing or hiccuping. Pain is well localized. She denies any history of DVT/PE, recent mobilizations, calf pain, leg swelling, shortness of breath. States that she has been taking ibuprofen, over-the-counter pain patches and her husbands diclofenac at home with little relief.  Past Medical History:  Diagnosis Date  . Back pain, chronic   . Bipolar disorder ([place])   . DM mellitus, gestational    history of gestational DM  . Neck pain, chronic   . Opioid dependence [facility])     Patient Active Problem List   Diagnosis Date Noted  . NEVUS 05/08/2009  . BIPOLAR DISORDER UNSPECIFIED 05/08/2009  . OPIOID TYPE DEPENDENCE CONTINUOUS ABUSE 05/08/2009  . TOBACCO USE 05/08/2009  . SINUS BRADYCARDIA 05/08/2009  . DIABETES MELLITUS-GESTATIONAL-CONTROLLED 05/08/2009  . 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 NECK PAIN, CHRONIC 05/08/2009  . BACK PAIN, CHRONIC 05/08/2009  . HEADACHE, CHRONIC 05/08/2009  . HEART MURMUR, BENIGN 05/08/2009    Past Surgical History:  Procedure Laterality Date  . Postlaminectomy syndrome cervical region    . TUBAL LIGATION      OB History    No data available       Home Medications    Prior to Admission medications   Medication Sig Start Date End Date Taking? Authorizing Provider    methocarbamol (ROBAXIN) 500 MG tablet Can take up to 1-2 tabs every 6 hours PRN PAIN 09/20/16   Monico Blitz, PA-C    Family History Family History  Problem Relation Age of Onset  . Cancer Mother 89    living currently  . Cancer Father 6    small cell lung cancer(smoker)  . Bradycardia Sister     2 half sisters with bradycardia    Social History Social History  Substance Use Topics  . Smoking status: Current Every Day Smoker    Packs/day: 0.50    Years: 18.00  . Smokeless tobacco: Never Used  . Alcohol use Yes     Allergies   Patient has no known allergies.   Review of Systems Review of Systems  10 systems reviewed and found to be negative, except as noted in the HPI.   Physical Exam Updated Vital Signs BP 176/98 (BP Location: Right Arm)   Pulse 60   Temp 97.7 F (36.5 C) (Oral)   Resp 16   LMP 09/14/2016 (Within Days)   SpO2 98%   Physical Exam  Constitutional: She is oriented to person, place, and time. She appears well-developed and well-nourished. No distress.  Appears uncomfortable  HENT:  Head: Normocephalic and atraumatic.  Mouth/Throat: Oropharynx is clear and moist.  Eyes: Conjunctivae and EOM are normal. Pupils are equal, round, and reactive to light.  Neck: Normal range of motion. No JVD present. No tracheal deviation present.  Cardiovascular: Normal rate, regular rhythm and  intact distal pulses.   Radial pulse equal bilaterally  Pulmonary/Chest: Effort normal and breath sounds normal. No stridor. No respiratory distress. She has no wheezes. She has no rales. She exhibits no tenderness.  Abdominal: Soft. She exhibits no distension and no mass. There is no tenderness. There is no rebound and no guarding.  Musculoskeletal: Normal range of motion. She exhibits no edema or tenderness.       Back:  No calf asymmetry, superficial collaterals, palpable cords, edema, Homans sign negative bilaterally.    Neurological: She is alert and oriented to  person, place, and time.  Skin: Skin is warm. She is not diaphoretic.  Psychiatric: She has a normal mood and affect.  Nursing note and vitals reviewed.    ED Treatments / Results  Labs (all labs ordered are listed, but only abnormal results are displayed) Labs Reviewed - No data to display  EKG  EKG Interpretation None       Radiology No results found.  Procedures Procedures (including critical care time)  Medications Ordered in ED Medications  ketorolac (TORADOL) injection 30 mg (30 mg Intramuscular Given 09/20/16 0836)  methocarbamol (ROBAXIN) tablet 500 mg (500 mg Oral Given 09/20/16 0835)  bupivacaine (MARCAINE) 0.5 % (with pres) injection 50 mL (1 mL Other Given 09/20/16 0918)  triamcinolone acetonide (KENALOG-40) injection 40 mg (40 mg Other Given 09/20/16 0919)     Initial Impression / Assessment and Plan / ED Course  I have reviewed the triage vital signs and the nursing notes.  Pertinent labs & imaging results that were available during my care of the patient were reviewed by me and considered in my medical decision making (see chart for details).  Clinical Course     Vitals:   09/20/16 0805 09/20/16 0830 09/20/16 0900 09/20/16 0952  BP: (!) 170/113 (!) 160/105 149/92 176/98  Pulse: 70 70 67 60  Resp: 16   16  Temp: 97.7 F (36.5 C)     TempSrc: Oral     SpO2: 99% 98% 98% 98%    Medications  ketorolac (TORADOL) injection 30 mg (30 mg Intramuscular Given 09/20/16 0836)  methocarbamol (ROBAXIN) tablet 500 mg (500 mg Oral Given 09/20/16 0835)  bupivacaine (MARCAINE) 0.5 % (with pres) injection 50 mL (1 mL Other Given 09/20/16 0918)  triamcinolone acetonide (KENALOG-40) injection 40 mg (40 mg Other Given 09/20/16 0919)    [name] is 40 y.o. female presenting with Recurrent posterior thoracic back pain, patient lifts regularly for work, lung sounds clear. I doubt this is PE/dissection. She is point tender and it's exacerbated by position this is  been recurrent over the course of several months. Patient is low risk by well's score and PERC negative. We'll give muscle relaxer and Toradol in addition to trigger point injection.  Patient with improved pain after medication trigger point injection in the ED. Case management has set up an appointment for primary care for her, work note provided.  Evaluation does not show pathology that would require ongoing emergent intervention or inpatient treatment. Pt is hemodynamically stable and mentating appropriately. Discussed findings and plan with patient/guardian, who agrees with care plan. All questions answered. Return precautions discussed and outpatient follow up given.      Final Clinical Impressions(s) / ED Diagnoses   Final diagnoses:  Musculoskeletal pain  Elevated blood pressure reading    New Prescriptions New Prescriptions   METHOCARBAMOL (ROBAXIN) 500 MG TABLET    Can take up to 1-2 tabs every 6 hours PRN  PAIN     Monico Blitz, PA-C 09/20/16 [place], [place] 09/21/16 [phone]

## 2016-09-20 NOTE — ED Triage Notes (Signed)
Pt reports back pain X2 weeks. Pt reports pain increases with deep breathing and sneezing. Pt ambulatory but reports severe pain. She has pain relief patch in place.

## 2016-09-20 NOTE — Discharge Instructions (Signed)
Please follow with your primary care doctor in the next 5 days for high blood pressure evaluation. If you do not have a primary care doctor, present to urgent care. Reduce salt intake. Seek emergency medical care for unilateral weakness, slurring, change in vision, or chest pain and shortness of breath.   For pain control you may take up to 800mg  of Motrin (also known as ibuprofen). That is usually 4 over the counter pills,  3 times a day. Take with food to minimize stomach irritation   You can also take  tylenol (acetaminophen) 975mg  (this is 3 over the counter pills) four times a day. Do not drink alcohol or combine with other medications that have acetaminophen as an ingredient (Read the labels!).    For breakthrough pain you may take Robaxin. Do not drink alcohol, drive or operate heavy machinery when taking Robaxin.  Please follow with your primary care doctor in the next 2 days for a check-up. They must obtain records for further management.   Do not hesitate to return to the Emergency Department for any new, worsening or concerning symptoms.

## 2016-10-13 ENCOUNTER — Ambulatory Visit: Payer: Medicaid Other | Admitting: Family Medicine

## 2018-02-15 ENCOUNTER — Other Ambulatory Visit: Payer: Self-pay

## 2018-02-15 ENCOUNTER — Emergency Department (HOSPITAL_COMMUNITY): Payer: BLUE CROSS/BLUE SHIELD

## 2018-02-15 ENCOUNTER — Emergency Department (HOSPITAL_COMMUNITY)
Admission: EM | Admit: 2018-02-15 | Discharge: 2018-02-15 | Disposition: A | Discharge status: Home / Self Care | Payer: BLUE CROSS/BLUE SHIELD | Attending: Emergency Medicine | Admitting: Emergency Medicine

## 2018-02-15 ENCOUNTER — Encounter (HOSPITAL_COMMUNITY): Payer: Self-pay | Admitting: Emergency Medicine

## 2018-02-15 DIAGNOSIS — E119 Type 2 diabetes mellitus without complications: Secondary | ICD-10-CM | POA: Diagnosis not present

## 2018-02-15 DIAGNOSIS — R319 Hematuria, unspecified: Secondary | ICD-10-CM

## 2018-02-15 DIAGNOSIS — F17200 Nicotine dependence, unspecified, uncomplicated: Secondary | ICD-10-CM | POA: Insufficient documentation

## 2018-02-15 DIAGNOSIS — N390 Urinary tract infection, site not specified: Secondary | ICD-10-CM | POA: Diagnosis not present

## 2018-02-15 DIAGNOSIS — N898 Other specified noninflammatory disorders of vagina: Secondary | ICD-10-CM | POA: Insufficient documentation

## 2018-02-15 DIAGNOSIS — Z79899 Other long term (current) drug therapy: Secondary | ICD-10-CM | POA: Diagnosis not present

## 2018-02-15 DIAGNOSIS — R1030 Lower abdominal pain, unspecified: Secondary | ICD-10-CM | POA: Diagnosis present

## 2018-02-15 LAB — URINALYSIS, ROUTINE W REFLEX MICROSCOPIC

## 2018-02-15 LAB — CBC
HEMATOCRIT: 40.2 % (ref 36.0–46.0)
Hemoglobin: 13.5 g/dL (ref 12.0–15.0)
MCH: 29.9 pg (ref 26.0–34.0)
MCHC: 33.6 g/dL (ref 30.0–36.0)
MCV: 88.9 fL (ref 78.0–100.0)
PLATELETS: 241 10*3/uL (ref 150–400)
RBC: 4.52 MIL/uL (ref 3.87–5.11)
RDW: 13.6 % (ref 11.5–15.5)
WBC: 10.9 10*3/uL — AB (ref 4.0–10.5)

## 2018-02-15 LAB — HEPATIC FUNCTION PANEL
ALT: 18 U/L (ref 14–54)
AST: 24 U/L (ref 15–41)
Albumin: 3.9 g/dL (ref 3.5–5.0)
Alkaline Phosphatase: 83 U/L (ref 38–126)
Bilirubin, Direct: 0.2 mg/dL (ref 0.1–0.5)
Indirect Bilirubin: 0.8 mg/dL (ref 0.3–0.9)
Total Bilirubin: 1 mg/dL (ref 0.3–1.2)
Total Protein: 7.1 g/dL (ref 6.5–8.1)

## 2018-02-15 LAB — BASIC METABOLIC PANEL
Anion gap: 13 (ref 5–15)
BUN: 5 mg/dL — ABNORMAL LOW (ref 6–20)
CHLORIDE: 104 mmol/L (ref 101–111)
CO2: 21 mmol/L — ABNORMAL LOW (ref 22–32)
Calcium: 9.2 mg/dL (ref 8.9–10.3)
Creatinine, Ser: 0.87 mg/dL (ref 0.44–1.00)
Glucose, Bld: 108 mg/dL — ABNORMAL HIGH (ref 65–99)
POTASSIUM: 3.5 mmol/L (ref 3.5–5.1)
Sodium: 138 mmol/L (ref 135–145)

## 2018-02-15 LAB — WET PREP, GENITAL
Sperm: NONE SEEN
Trich, Wet Prep: NONE SEEN
Yeast Wet Prep HPF POC: NONE SEEN

## 2018-02-15 LAB — URINALYSIS, MICROSCOPIC (REFLEX)

## 2018-02-15 LAB — I-STAT BETA HCG BLOOD, ED (MC, WL, AP ONLY): I-stat hCG, quantitative: <5 m[IU]/mL (ref <5)

## 2018-02-15 LAB — LIPASE, BLOOD: Lipase: 24 U/L (ref 11–51)

## 2018-02-15 MED ORDER — FENTANYL CITRATE (PF) 100 MCG/2ML IJ SOLN
50.0000 ug | Freq: Once | INTRAMUSCULAR | Status: AC
Start: 2018-02-15 — End: 2018-02-15
  Administered 2018-02-15: 50 ug via INTRAVENOUS
  Filled 2018-02-15: qty 2

## 2018-02-15 MED ORDER — METRONIDAZOLE 500 MG PO TABS: 500.0000 mg | ORAL_TABLET | Freq: Two times a day (BID) | ORAL | 0 refills | Status: AC

## 2018-02-15 MED ORDER — ONDANSETRON HCL 4 MG PO TABS: 4.0000 mg | ORAL_TABLET | Freq: Four times a day (QID) | ORAL | 0 refills | Status: DC

## 2018-02-15 MED ORDER — SODIUM CHLORIDE 0.9 % IV BOLUS
1000.0000 mL | Freq: Once | INTRAVENOUS | Status: AC
  Administered 2018-02-15: 1000 mL via INTRAVENOUS

## 2018-02-15 MED ORDER — CEPHALEXIN 500 MG PO CAPS: 500.0000 mg | ORAL_CAPSULE | Freq: Three times a day (TID) | ORAL | 0 refills | Status: DC

## 2018-02-15 MED ORDER — MORPHINE SULFATE (PF) 4 MG/ML IV SOLN
4.0000 mg | Freq: Once | INTRAVENOUS | Status: AC
  Administered 2018-02-15: 4 mg via INTRAVENOUS
  Filled 2018-02-15: qty 1

## 2018-02-15 MED ORDER — AZITHROMYCIN 250 MG PO TABS
1000.0000 mg | ORAL_TABLET | Freq: Once | ORAL | Status: AC
  Administered 2018-02-15: 1000 mg via ORAL
  Filled 2018-02-15: qty 4

## 2018-02-15 MED ORDER — IOPAMIDOL (ISOVUE-300) INJECTION 61%
100.0000 mL | Freq: Once | INTRAVENOUS | Status: AC
  Administered 2018-02-15: 100 mL via INTRAVENOUS

## 2018-02-15 MED ORDER — SODIUM CHLORIDE 0.9 % IV SOLN
1.0000 g | Freq: Once | INTRAVENOUS | Status: AC
  Administered 2018-02-15: 1 g via INTRAVENOUS
  Filled 2018-02-15: qty 10

## 2018-02-15 MED ORDER — ONDANSETRON HCL 4 MG/2ML IJ SOLN
4.0000 mg | Freq: Once | INTRAMUSCULAR | Status: AC
  Administered 2018-02-15: 4 mg via INTRAVENOUS
  Filled 2018-02-15: qty 2

## 2018-02-15 MED ORDER — IOPAMIDOL (ISOVUE-300) INJECTION 61%
INTRAVENOUS | Status: AC
  Filled 2018-02-15: qty 100

## 2018-02-15 NOTE — ED Triage Notes (Signed)
Patient states she feels like she has a UTI. Complains of urinary frequency but also being unable to urinate sometimes, burning with urination, lower back pain, vaginal discharge. All symptoms started yesterday.

## 2018-02-15 NOTE — ED Provider Notes (Signed)
[place] EMERGENCY DEPARTMENT Provider Note   CSN: [id number] Arrival date & time: 02/15/18  1104     History   Chief Complaint Chief Complaint  Patient presents with  . Urinary Tract Infection    HPI [name] is a 42 y.o. female.  HPI   42 year old female presents today with complaints of abdominal and back pain.  Patient reports 2 days ago she developed pain with urination.  She notes this morning she woke up with nausea and dry heaving, bilateral lower abdominal pain with radiation to the back.  She does note some chronic back pain, but this feels slightly worse.  Patient reports she has urinary frequency, painful urination, and the sensation of incomplete void.  Patient also notes today she has had greenish vaginal discharge.  She reports she is sexually active with men.  Patient denies any fevers at home.  Denies any history of abdominal surgery, kidney stones, no recent antibiotic exposure.  Patient denies any recent narcotics, reports using Aleve yesterday.   Past Medical History:  Diagnosis Date  . Back pain, chronic   . Bipolar disorder ([place])   . DM mellitus, gestational    history of gestational DM  . Neck pain, chronic   . Opioid dependence [facility])     Patient Active Problem List   Diagnosis Date Noted  . NEVUS 05/08/2009  . BIPOLAR DISORDER UNSPECIFIED 05/08/2009  . OPIOID TYPE DEPENDENCE CONTINUOUS ABUSE 05/08/2009  . TOBACCO USE 05/08/2009  . SINUS BRADYCARDIA 05/08/2009  . DIABETES MELLITUS-GESTATIONAL-CONTROLLED 05/08/2009  . 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 NECK PAIN, CHRONIC 05/08/2009  . BACK PAIN, CHRONIC 05/08/2009  . HEADACHE, CHRONIC 05/08/2009  . HEART MURMUR, BENIGN 05/08/2009    Past Surgical History:  Procedure Laterality Date  . Postlaminectomy syndrome cervical region    . TUBAL LIGATION       OB History   None      Home Medications    Prior to Admission medications   Medication  Sig Start Date End Date Taking? Authorizing Provider  cephALEXin (KEFLEX) 500 MG capsule Take 1 capsule (500 mg total) by mouth 3 (three) times daily. 02/15/18   Jaliyah Fotheringham, Dellis Filbert, PA-C  methocarbamol (ROBAXIN) 500 MG tablet Can take up to 1-2 tabs every 6 hours PRN PAIN 09/20/16   Pisciotta, Elmyra Ricks, PA-C  metroNIDAZOLE (FLAGYL) 500 MG tablet Take 1 tablet (500 mg total) by mouth 2 (two) times daily. 02/15/18   Jacquan Savas, Dellis Filbert, PA-C  ondansetron (ZOFRAN) 4 MG tablet Take 1 tablet (4 mg total) by mouth every 6 (six) hours. 02/15/18   Okey Regal, PA-C    Family History Family History  Problem Relation Age of Onset  . Cancer Mother 76       living currently  . Cancer Father 108       small cell lung cancer(smoker)  . Bradycardia Sister        2 half sisters with bradycardia    Social History Social History   Tobacco Use  . Smoking status: Current Every Day Smoker    Packs/day: 0.50    Years: 18.00    Pack years: 9.00  . Smokeless tobacco: Never Used  Substance Use Topics  . Alcohol use: Yes  . Drug use: No     Allergies   Patient has no known allergies.   Review of Systems Review of Systems  All other systems reviewed and are negative.   Physical Exam Updated Vital Signs BP (!) 156/96  Pulse 70   Temp 99.6 F (37.6 C) (Oral)   Resp 16   Ht 5\' 9"  (1.753 m)   Wt 99.8 kg (220 lb)   LMP 02/15/2018 (Exact Date)   SpO2 97%   BMI 32.49 kg/m   Physical Exam  Constitutional: She is oriented to person, place, and time. She appears well-developed and well-nourished.  HENT:  Head: Normocephalic and atraumatic.  Eyes: Pupils are equal, round, and reactive to light. Conjunctivae are normal. Right eye exhibits no discharge. Left eye exhibits no discharge. No scleral icterus.  Neck: Normal range of motion. No JVD present. No tracheal deviation present.  Pulmonary/Chest: Effort normal. No stridor.  Abdominal: Soft. She exhibits no distension and no mass. There is no  tenderness. There is no rebound and no guarding. No hernia.  Genitourinary:  Genitourinary Comments: Uncomfortable vaginal exam with no focal findings including cervical motion tenderness or masses, blood noted throughout the vaginal vault no obvious discharge  Neurological: She is alert and oriented to person, place, and time. Coordination normal.  Psychiatric: She has a normal mood and affect. Her behavior is normal. Judgment and thought content normal.  Nursing note and vitals reviewed.    ED Treatments / Results  Labs (all labs ordered are listed, but only abnormal results are displayed) Labs Reviewed  WET PREP, GENITAL - Abnormal; Notable for the following components:      Result Value   Clue Cells Wet Prep HPF POC PRESENT (*)    WBC, Wet Prep HPF POC MODERATE (*)    All other components within normal limits  URINALYSIS, ROUTINE W REFLEX MICROSCOPIC - Abnormal; Notable for the following components:   Color, Urine RED (*)    APPearance TURBID (*)    Glucose, UA   (*)    Value: TEST NOT REPORTED DUE TO COLOR INTERFERENCE OF URINE PIGMENT   Hgb urine dipstick   (*)    Value: TEST NOT REPORTED DUE TO COLOR INTERFERENCE OF URINE PIGMENT   Bilirubin Urine   (*)    Value: TEST NOT REPORTED DUE TO COLOR INTERFERENCE OF URINE PIGMENT   Ketones, ur   (*)    Value: TEST NOT REPORTED DUE TO COLOR INTERFERENCE OF URINE PIGMENT   Protein, ur   (*)    Value: TEST NOT REPORTED DUE TO COLOR INTERFERENCE OF URINE PIGMENT   Nitrite   (*)    Value: TEST NOT REPORTED DUE TO COLOR INTERFERENCE OF URINE PIGMENT   Leukocytes, UA   (*)    Value: TEST NOT REPORTED DUE TO COLOR INTERFERENCE OF URINE PIGMENT   All other components within normal limits  BASIC METABOLIC PANEL - Abnormal; Notable for the following components:   CO2 21 (*)    Glucose, Bld 108 (*)    BUN 5 (*)    All other components within normal limits  CBC - Abnormal; Notable for the following components:   WBC 10.9 (*)    All  other components within normal limits  URINALYSIS, MICROSCOPIC (REFLEX) - Abnormal; Notable for the following components:   Bacteria, UA FEW (*)    Squamous Epithelial / LPF 6-30 (*)    All other components within normal limits  URINE CULTURE  HEPATIC FUNCTION PANEL  LIPASE, BLOOD  I-STAT BETA HCG BLOOD, ED ([facility], [facility], [facility] ONLY)  GC/CHLAMYDIA PROBE AMP ([place]) NOT AT [facility]    EKG None  Radiology Ct Abdomen Pelvis W Contrast  Result Date: 02/15/2018 CLINICAL DATA:  Pelvic pain  for the past day.  Unable to urinate. EXAM: CT ABDOMEN AND PELVIS WITH CONTRAST TECHNIQUE: Multidetector CT imaging of the abdomen and pelvis was performed using the standard protocol following bolus administration of intravenous contrast. CONTRAST:  173mL ISOVUE-300 IOPAMIDOL (ISOVUE-300) INJECTION 61% COMPARISON:  None. FINDINGS: Lower chest: No acute abnormality. Hepatobiliary: Tiny subcentimeter low-density lesion in the left hepatic lobe is too small to characterize. No other focal liver abnormality. Gallbladder is unremarkable. No biliary dilatation. Pancreas: Unremarkable. No pancreatic ductal dilatation or surrounding inflammatory changes. Spleen: Normal in size without focal abnormality.  Small splenule. Adrenals/Urinary Tract: The adrenal glands are unremarkable. The kidneys are unremarkable. No renal or ureteral calculi. No hydronephrosis. Urothelial enhancement throughout the entire right ureter with mild periureteral inflammatory changes along the mid to distal ureter. Urothelial enhancement and mild periureteral inflammatory changes along the mid to distal left ureter. Moderate circumferential bladder wall thickening and enhancement with mild perivesical inflammatory changes. Stomach/Bowel: Stomach is within normal limits. Appendix appears normal. No evidence of bowel wall thickening, distention, or inflammatory changes. Vascular/Lymphatic: No significant vascular findings are present. No enlarged abdominal or  pelvic lymph nodes. Reproductive: Uterus and bilateral adnexa are unremarkable. Dominant follicle in the right ovary. Nabothian cyst in the cervix. Other: Tiny fat containing umbilical hernia. No free fluid or pneumoperitoneum. Musculoskeletal: No acute or significant osseous findings. Degenerative changes of the sacroiliac joints. IMPRESSION: 1. Urothelial enhancement and inflammatory changes surrounding the bladder and bilateral ureters, consistent with urinary tract infection. No evidence of pyelonephritis. Electronically Signed   By: Titus Dubin M.D.   On: 02/15/2018 15:57    Procedures Procedures (including critical care time)  Medications Ordered in ED Medications  iopamidol (ISOVUE-300) 61 % injection (has no administration in time range)  azithromycin (ZITHROMAX) tablet 1,000 mg (has no administration in time range)  morphine 4 MG/ML injection 4 mg (4 mg Intravenous Given 02/15/18 1424)  ondansetron (ZOFRAN) injection 4 mg (4 mg Intravenous Given 02/15/18 1426)  sodium chloride 0.9 % bolus 1,000 mL (1,000 mLs Intravenous New Bag/Given 02/15/18 1424)  iopamidol (ISOVUE-300) 61 % injection 100 mL (100 mLs Intravenous Contrast Given 02/15/18 1448)  fentaNYL (SUBLIMAZE) injection 50 mcg (50 mcg Intravenous Given 02/15/18 1615)  cefTRIAXone (ROCEPHIN) 1 g in sodium chloride 0.9 % 100 mL IVPB (1 g Intravenous New Bag/Given 02/15/18 1616)     Initial Impression / Assessment and Plan / ED Course  I have reviewed the triage vital signs and the nursing notes.  Pertinent labs & imaging results that were available during my care of the patient were reviewed by me and considered in my medical decision making (see chart for details).      Final Clinical Impressions(s) / ED Diagnoses   Final diagnoses:  Urinary tract infection with hematuria, site unspecified  Vaginal discharge    Labs: I-STAT beta-hCG, urinalysis, BMP, CBC  Imaging: CT abd pelvis with contrast    Consults:  Therapeutics: ceftriaxone, azithromycin   Discharge Meds: metronidazole, keflex  Assessment/Plan: 42 year old female presents today with likely urinary tract infection.  Patient with lower abdominal pain, urinary symptoms, and CT scan showing findings consistent with cystitis and inflammation of the bilateral ureters.  No signs of pyelonephritis, no obstruction.  Patient also notes vaginal discharge, this is difficult to assess given she is on her menstrual cycle.  Patient has no clinical signs of torsion or any acute findings.  Patient prophylactically treated with ceftriaxone for both urinary tract infection and STDs, azithromycin, discharged with metronidazole and Keflex.  Patient will use antibiotics as directed, return to emergency room if symptoms worsen or do not improve within the next 2 days.  Patient verbalized understanding and agreement to today's plan and had no further questions or concerns at the time of discharge.    ED Discharge Orders        Ordered    cephALEXin (KEFLEX) 500 MG capsule  3 times daily     02/15/18 1730    ondansetron (ZOFRAN) 4 MG tablet  Every 6 hours     02/15/18 1730    metroNIDAZOLE (FLAGYL) 500 MG tablet  2 times daily     02/15/18 1730       Francee Gentile 02/15/18 1732    Jola Schmidt, MD 02/15/18 2308

## 2018-02-15 NOTE — Discharge Instructions (Addendum)
Please read attached information. If you experience any new or worsening signs or symptoms please return to the emergency room for evaluation. Please follow-up with your primary care provider or specialist as discussed. Please use medication prescribed only as directed and discontinue taking if you have any concerning signs or symptoms.   °

## 2018-02-16 LAB — GC/CHLAMYDIA PROBE AMP (~~LOC~~) NOT AT ARMC
Chlamydia: NEGATIVE
Neisseria Gonorrhea: NEGATIVE

## 2018-02-17 LAB — URINE CULTURE: Culture: >=100000 — AB

## 2018-02-18 ENCOUNTER — Telehealth: Payer: Self-pay

## 2018-02-18 NOTE — Telephone Encounter (Signed)
Post ED Visit - Positive Culture Follow-up  Culture report reviewed by antimicrobial stewardship pharmacist:  []  Elenor Quinones, Pharm.D. []  Heide Guile, Pharm.D., BCPS AQ-ID []  Parks Neptune, Pharm.D., BCPS []  Alycia Rossetti, Pharm.D., BCPS []  [place], Pharm.D., BCPS, AAHIVP []  Legrand Como, Pharm.D., BCPS, AAHIVP []  Salome Arnt, PharmD, BCPS []  Jalene Mullet, PharmD []  Vincenza Hews, PharmD, BCPS Jimmy Footman Pharm D Positive urine culture Treated with Cephalexin, organism sensitive to the same and no further patient follow-up is required at this time.  Genia Del 02/18/2018, 10:04 AM

## 2018-09-06 ENCOUNTER — Ambulatory Visit: Payer: BLUE CROSS/BLUE SHIELD | Admitting: Podiatry

## 2018-11-28 ENCOUNTER — Encounter (HOSPITAL_COMMUNITY): Payer: Self-pay | Admitting: Emergency Medicine

## 2018-11-28 ENCOUNTER — Emergency Department (HOSPITAL_COMMUNITY): Payer: Managed Care, Other (non HMO)

## 2018-11-28 ENCOUNTER — Other Ambulatory Visit: Payer: Self-pay

## 2018-11-28 ENCOUNTER — Emergency Department (HOSPITAL_COMMUNITY)
Admission: EM | Admit: 2018-11-28 | Discharge: 2018-11-28 | Disposition: A | Discharge status: Home / Self Care | Payer: Managed Care, Other (non HMO) | Attending: Emergency Medicine | Admitting: Emergency Medicine

## 2018-11-28 DIAGNOSIS — E119 Type 2 diabetes mellitus without complications: Secondary | ICD-10-CM | POA: Insufficient documentation

## 2018-11-28 DIAGNOSIS — N12 Tubulo-interstitial nephritis, not specified as acute or chronic: Secondary | ICD-10-CM | POA: Diagnosis not present

## 2018-11-28 DIAGNOSIS — F17210 Nicotine dependence, cigarettes, uncomplicated: Secondary | ICD-10-CM | POA: Diagnosis not present

## 2018-11-28 DIAGNOSIS — R109 Unspecified abdominal pain: Secondary | ICD-10-CM | POA: Diagnosis present

## 2018-11-28 LAB — COMPREHENSIVE METABOLIC PANEL
ALT: 41 U/L (ref 0–44)
AST: 30 U/L (ref 15–41)
Albumin: 3.5 g/dL (ref 3.5–5.0)
Alkaline Phosphatase: 80 U/L (ref 38–126)
Anion gap: 10 (ref 5–15)
BUN: 5 mg/dL — ABNORMAL LOW (ref 6–20)
CO2: 24 mmol/L (ref 22–32)
Calcium: 9.2 mg/dL (ref 8.9–10.3)
Chloride: 102 mmol/L (ref 98–111)
Creatinine, Ser: 0.88 mg/dL (ref 0.44–1.00)
GFR calc Af Amer: >60 mL/min (ref >60)
GFR calc non Af Amer: >60 mL/min (ref >60)
Glucose, Bld: 106 mg/dL — ABNORMAL HIGH (ref 70–99)
Potassium: 3.4 mmol/L — ABNORMAL LOW (ref 3.5–5.1)
Sodium: 136 mmol/L (ref 135–145)
Total Bilirubin: 0.8 mg/dL (ref 0.3–1.2)
Total Protein: 7.2 g/dL (ref 6.5–8.1)

## 2018-11-28 LAB — URINALYSIS, ROUTINE W REFLEX MICROSCOPIC
Bilirubin Urine: NEGATIVE
Glucose, UA: NEGATIVE mg/dL
Ketones, ur: NEGATIVE mg/dL
Nitrite: POSITIVE — AB
Protein, ur: 100 mg/dL — AB
Specific Gravity, Urine: 1.023 (ref 1.005–1.030)
WBC, UA: >50 WBC/hpf — ABNORMAL HIGH (ref 0–5)
pH: 5 (ref 5.0–8.0)

## 2018-11-28 LAB — CBC WITH DIFFERENTIAL/PLATELET
Abs Immature Granulocytes: 0.04 10*3/uL (ref 0.00–0.07)
BASOS ABS: 0 10*3/uL (ref 0.0–0.1)
Basophils Relative: 0 %
Eosinophils Absolute: 0 10*3/uL (ref 0.0–0.5)
Eosinophils Relative: 0 %
HCT: 37.7 % (ref 36.0–46.0)
Hemoglobin: 12.3 g/dL (ref 12.0–15.0)
Immature Granulocytes: 1 %
Lymphocytes Relative: 8 %
Lymphs Abs: 0.7 10*3/uL (ref 0.7–4.0)
MCH: 28.6 pg (ref 26.0–34.0)
MCHC: 32.6 g/dL (ref 30.0–36.0)
MCV: 87.7 fL (ref 80.0–100.0)
Monocytes Absolute: 0.6 10*3/uL (ref 0.1–1.0)
Monocytes Relative: 7 %
Neutro Abs: 7.4 10*3/uL (ref 1.7–7.7)
Neutrophils Relative %: 84 %
Platelets: 232 10*3/uL (ref 150–400)
RBC: 4.3 MIL/uL (ref 3.87–5.11)
RDW: 13.2 % (ref 11.5–15.5)
WBC: 8.8 10*3/uL (ref 4.0–10.5)
nRBC: 0 % (ref 0.0–0.2)

## 2018-11-28 LAB — SALICYLATE LEVEL: Salicylate Lvl: <7 mg/dL (ref 2.8–30.0)

## 2018-11-28 LAB — PREGNANCY, URINE: Preg Test, Ur: NEGATIVE

## 2018-11-28 MED ORDER — HYDROMORPHONE HCL 1 MG/ML IJ SOLN
1.0000 mg | Freq: Once | INTRAMUSCULAR | Status: AC
  Administered 2018-11-28: 1 mg via INTRAVENOUS
  Filled 2018-11-28: qty 1

## 2018-11-28 MED ORDER — SODIUM CHLORIDE 0.9 % IV BOLUS
1000.0000 mL | Freq: Once | INTRAVENOUS | Status: AC
  Administered 2018-11-28: 1000 mL via INTRAVENOUS

## 2018-11-28 MED ORDER — HYDROCODONE-ACETAMINOPHEN 5-325 MG PO TABS: 1.0000 | ORAL_TABLET | Freq: Four times a day (QID) | ORAL | 0 refills | Status: DC | PRN

## 2018-11-28 MED ORDER — CEPHALEXIN 500 MG PO CAPS: 500.0000 mg | ORAL_CAPSULE | Freq: Three times a day (TID) | ORAL | 0 refills | Status: DC

## 2018-11-28 MED ORDER — MORPHINE SULFATE (PF) 4 MG/ML IV SOLN
4.0000 mg | Freq: Once | INTRAVENOUS | Status: AC
  Administered 2018-11-28: 4 mg via INTRAVENOUS
  Filled 2018-11-28: qty 1

## 2018-11-28 MED ORDER — KETOROLAC TROMETHAMINE 30 MG/ML IJ SOLN
30.0000 mg | Freq: Once | INTRAMUSCULAR | Status: AC
  Administered 2018-11-28: 30 mg via INTRAVENOUS
  Filled 2018-11-28: qty 1

## 2018-11-28 MED ORDER — SODIUM CHLORIDE 0.9 % IV SOLN
1.0000 g | Freq: Once | INTRAVENOUS | Status: AC
  Administered 2018-11-28: 1 g via INTRAVENOUS
  Filled 2018-11-28: qty 10

## 2018-11-28 MED ORDER — ONDANSETRON HCL 4 MG PO TABS: 4.0000 mg | ORAL_TABLET | Freq: Three times a day (TID) | ORAL | 0 refills | Status: AC | PRN

## 2018-11-28 MED ORDER — ACETAMINOPHEN 325 MG PO TABS
650.0000 mg | ORAL_TABLET | Freq: Once | ORAL | Status: AC
  Administered 2018-11-28: 650 mg via ORAL
  Filled 2018-11-28: qty 2

## 2018-11-28 NOTE — Discharge Instructions (Signed)
You have a kidney infection.   Take keflex three times daily for 10 days   Take motrin or tylenol for pain   Take zofran for nausea.   Take vicodin for severe pain .  See your doctor  Return to ER if you have worse vomiting, flank pain, fever for a week.

## 2018-11-28 NOTE — ED Provider Notes (Signed)
[place] EMERGENCY DEPARTMENT Provider Note   CSN: [id number] Arrival date & time: 11/28/18  1832     History   Chief Complaint Chief Complaint  Patient presents with  . Abdominal Pain  . Back Pain  . Nausea    HPI [name] is a 43 y.o. female history of diabetes, previous UTI, here presenting with right flank pain, fever.  States that she has right flank pain intermittently for the last 3 days.  States that it progressively got worse and now is constant since yesterday.  She has some subjective chills and urinary frequency.  She denies any dysuria.  She states that she has similar symptoms when she had a urinary tract infection previously.  She states that she is nauseated as well. Denies any history of kidney stones however.She has been taking motrin with no relief.   The history is provided by the patient.    Past Medical History:  Diagnosis Date  . Back pain, chronic   . Bipolar disorder ([place])   . DM mellitus, gestational    history of gestational DM  . Neck pain, chronic   . Opioid dependence [facility])     Patient Active Problem List   Diagnosis Date Noted  . NEVUS 05/08/2009  . BIPOLAR DISORDER UNSPECIFIED 05/08/2009  . OPIOID TYPE DEPENDENCE CONTINUOUS ABUSE 05/08/2009  . TOBACCO USE 05/08/2009  . SINUS BRADYCARDIA 05/08/2009  . DIABETES MELLITUS-GESTATIONAL-CONTROLLED 05/08/2009  . 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 NECK PAIN, CHRONIC 05/08/2009  . BACK PAIN, CHRONIC 05/08/2009  . HEADACHE, CHRONIC 05/08/2009  . HEART MURMUR, BENIGN 05/08/2009    Past Surgical History:  Procedure Laterality Date  . Postlaminectomy syndrome cervical region    . TUBAL LIGATION       OB History   No obstetric history on file.      Home Medications    Prior to Admission medications   Medication Sig Start Date End Date Taking? Authorizing Provider  ibuprofen (ADVIL,MOTRIN) 200 MG tablet Take 800 mg by mouth every 6 (six)  hours as needed for mild pain.   Yes [provider]  cephALEXin (KEFLEX) 500 MG capsule Take 1 capsule (500 mg total) by mouth 3 (three) times daily. Patient not taking: Reported on 11/28/2018 02/15/18   Hedges, Dellis Filbert, PA-C  methocarbamol (ROBAXIN) 500 MG tablet Can take up to 1-2 tabs every 6 hours PRN PAIN Patient not taking: Reported on 11/28/2018 09/20/16   Pisciotta, Elmyra Ricks, PA-C  metroNIDAZOLE (FLAGYL) 500 MG tablet Take 1 tablet (500 mg total) by mouth 2 (two) times daily. Patient not taking: Reported on 11/28/2018 02/15/18   Hedges, Dellis Filbert, PA-C  ondansetron (ZOFRAN) 4 MG tablet Take 1 tablet (4 mg total) by mouth every 6 (six) hours. Patient not taking: Reported on 11/28/2018 02/15/18   Okey Regal, PA-C    Family History Family History  Problem Relation Age of Onset  . Cancer Mother 28       living currently  . Cancer Father 27       small cell lung cancer(smoker)  . Bradycardia Sister        2 half sisters with bradycardia    Social History Social History   Tobacco Use  . Smoking status: Current Every Day Smoker    Packs/day: 0.50    Years: 18.00    Pack years: 9.00  . Smokeless tobacco: Never Used  Substance Use Topics  . Alcohol use: Yes  . Drug use: No  Allergies   Patient has no known allergies.   Review of Systems Review of Systems  Gastrointestinal: Positive for abdominal pain.  Musculoskeletal: Positive for back pain.  All other systems reviewed and are negative.    Physical Exam Updated Vital Signs BP 135/79   Pulse 79   Temp (!) 100.4 F (38 C) (Oral)   Resp 18   Ht 5\' 6"  (1.676 m)   Wt 106.6 kg   LMP 11/07/2018   SpO2 98%   BMI 37.93 kg/m   Physical Exam Vitals signs and nursing note reviewed.  Constitutional:      Comments: Uncomfortable   HENT:     Head: Normocephalic.     Mouth/Throat:     Comments: MM slightly dry  Eyes:     Extraocular Movements: Extraocular movements intact.     Pupils: Pupils are equal,  round, and reactive to light.  Cardiovascular:     Rate and Rhythm: Normal rate and regular rhythm.  Pulmonary:     Effort: Pulmonary effort is normal.     Breath sounds: Normal breath sounds.  Abdominal:     General: Abdomen is flat. Bowel sounds are normal.     Palpations: Abdomen is soft.     Comments: + R CVAT   Skin:    General: Skin is warm.     Capillary Refill: Capillary refill takes less than 2 seconds.  Neurological:     General: No focal deficit present.     Mental Status: She is alert.  Psychiatric:        Mood and Affect: Mood normal.        Behavior: Behavior normal.      ED Treatments / Results  Labs (all labs ordered are listed, but only abnormal results are displayed) Labs Reviewed  URINALYSIS, ROUTINE W REFLEX MICROSCOPIC - Abnormal; Notable for the following components:      Result Value   APPearance HAZY (*)    Hgb urine dipstick MODERATE (*)    Protein, ur 100 (*)    Nitrite POSITIVE (*)    Leukocytes, UA MODERATE (*)    WBC, UA >50 (*)    Bacteria, UA MANY (*)    All other components within normal limits  COMPREHENSIVE METABOLIC PANEL - Abnormal; Notable for the following components:   Potassium 3.4 (*)    Glucose, Bld 106 (*)    BUN 5 (*)    All other components within normal limits  URINE CULTURE  PREGNANCY, URINE  CBC WITH DIFFERENTIAL/PLATELET  SALICYLATE LEVEL    EKG None  Radiology Ct Renal Stone Study  Result Date: 11/28/2018 CLINICAL DATA:  Right-sided flank pain and lower back pain for several days. EXAM: CT ABDOMEN AND PELVIS WITHOUT CONTRAST TECHNIQUE: Multidetector CT imaging of the abdomen and pelvis was performed following the standard protocol without IV contrast. COMPARISON:  February 15, 2017 FINDINGS: Lower chest: No acute abnormality. Hepatobiliary: Hepatic steatosis.  The gallbladder is unremarkable. Pancreas: No suspicious change in the pancreas. Focal fatty deposition in the head and neck. Spleen: Normal in size without  focal abnormality. Adrenals/Urinary Tract: Adrenal glands are normal. Mild stranding in the fat adjacent to the bladder, less than seen in April of 2018. No renal stones or hydronephrosis. No left perinephric stranding. The left ureter is normal. There is fat stranding adjacent to the right kidney and right proximal ureter. No dilatation of the ureter. No right ureteral stone. The findings in the bladder and right proximal ureter are similar  in the interval. Stomach/Bowel: Stomach and small bowel are normal. A few scattered colonic diverticuli are identified without diverticulitis. The appendix is normal. Vascular/Lymphatic: No significant vascular findings are present. No enlarged abdominal or pelvic lymph nodes. Reproductive: Nabothian cyst in the cervix. New 2.7 cm cyst associated with the left ovary. The right ovary is normal. Other: No abdominal wall hernia or abnormality. No abdominopelvic ascites. Musculoskeletal: No acute or significant osseous findings. IMPRESSION: 1. Fat stranding adjacent to the right kidney and right proximal ureter is identified. There is mild stranding adjacent to the bladder, decreased in the interval. The findings may be infectious. In the setting of infection, pyelonephritis can not be excluded on this unenhanced study. A recently passed stone could have a similar appearance. Recommend clinical correlation and correlation with urinalysis. 2. Hepatic steatosis. 3. Dominant 2.7 cm follicle the left ovary. Electronically Signed   By: Dorise Bullion III M.D   On: 11/28/2018 20:50    Procedures Procedures (including critical care time)  Medications Ordered in ED Medications  sodium chloride 0.9 % bolus 1,000 mL (1,000 mLs Intravenous New Bag/Given 11/28/18 2136)  acetaminophen (TYLENOL) tablet 650 mg (650 mg Oral Given 11/28/18 2136)  morphine 4 MG/ML injection 4 mg (4 mg Intravenous Given 11/28/18 2130)  cefTRIAXone (ROCEPHIN) 1 g in sodium chloride 0.9 % 100 mL IVPB (0 g  Intravenous Stopped 11/28/18 2221)  HYDROmorphone (DILAUDID) injection 1 mg (1 mg Intravenous Given 11/28/18 2212)  ketorolac (TORADOL) 30 MG/ML injection 30 mg (30 mg Intravenous Given 11/28/18 2224)     Initial Impression / Assessment and Plan / ED Course  I have reviewed the triage vital signs and the nursing notes.  Pertinent labs & imaging results that were available during my care of the patient were reviewed by me and considered in my medical decision making (see chart for details).    [name] is a 43 y.o. female here with R flank pain, fever. Likely pyelonephritis vs infected kidney stone. Will get labs, UA, CT renal stone. Will give pain meds and reassess.   10:53 PM WBC nl. CT showed pyelo but no stone or hydro. UA + UTI. Given rocephin, IVF and pain medicines and felt better. Doesn't appear septic. Will dc home with keflex, vicodin, zofran.    Final Clinical Impressions(s) / ED Diagnoses   Final diagnoses:  None    ED Discharge Orders    None       Drenda Freeze, MD 11/28/18 2253

## 2018-11-28 NOTE — ED Notes (Signed)
Pts pain is gone at present

## 2018-11-28 NOTE — ED Triage Notes (Signed)
Pt. Stated, I started having Nausea with back pain going to my stomach since last Monday.

## 2018-11-30 LAB — URINE CULTURE

## 2019-04-23 IMAGING — CT CT ABD-PELV W/ CM
2 of 5 series · 15 of 46 positions shown, 17 images · IV contrast (APPLIED)
Comparison: None.

CLINICAL DATA: Pelvic pain for the past day.  Unable to urinate.

EXAM:
CT ABDOMEN AND PELVIS WITH CONTRAST
TECHNIQUE: Multidetector CT imaging of the abdomen and pelvis was performed
using the standard protocol following bolus administration of
intravenous contrast.
CONTRAST:  100mL U3X3M0-MYY IOPAMIDOL (U3X3M0-MYY) INJECTION 61%

[Series 5: abdomen 3.0 mpr cor · coronal · 0.76mm/px · 3 of 88 slices shown]
[im 30/88  soft-tissue]
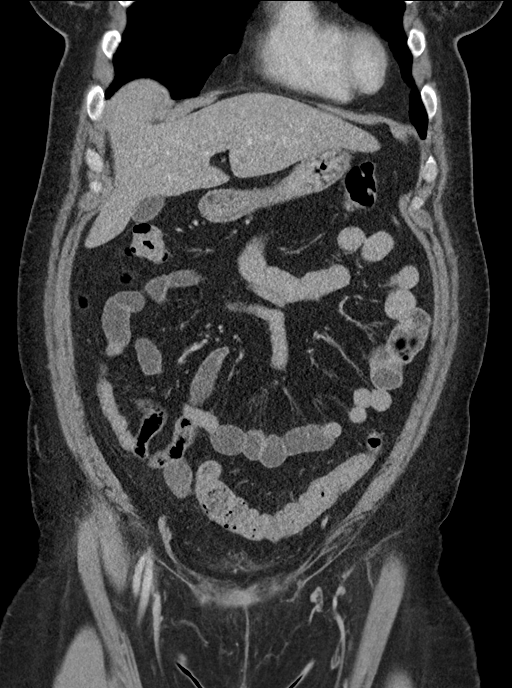
[im 39/88  soft-tissue]
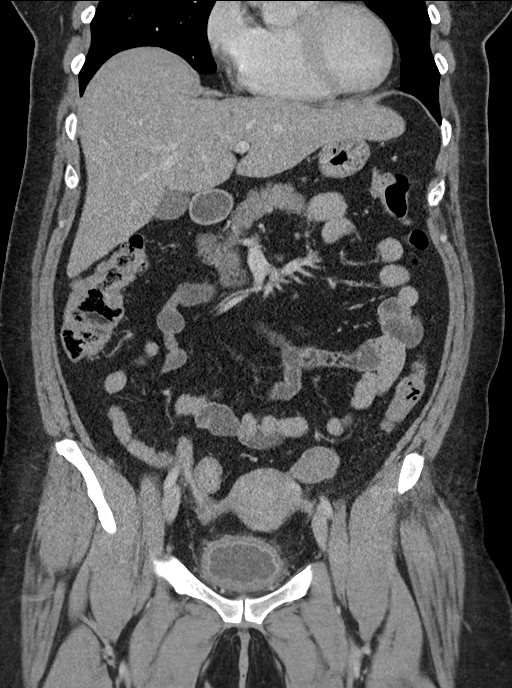
[im 49/88  soft-tissue]
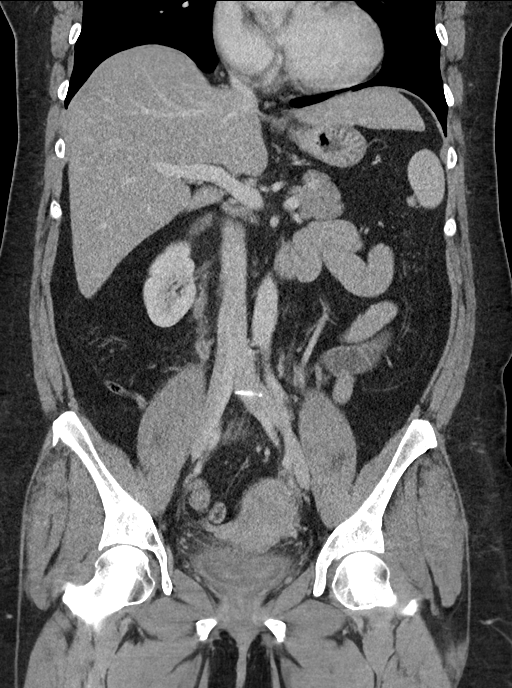

[Series 8: abdomen 5.0 · axial · 0.74mm/px · z∈[+716,+1161]mm · 12 of 105 slices shown, 14 images]
[im 8/105  soft-tissue]
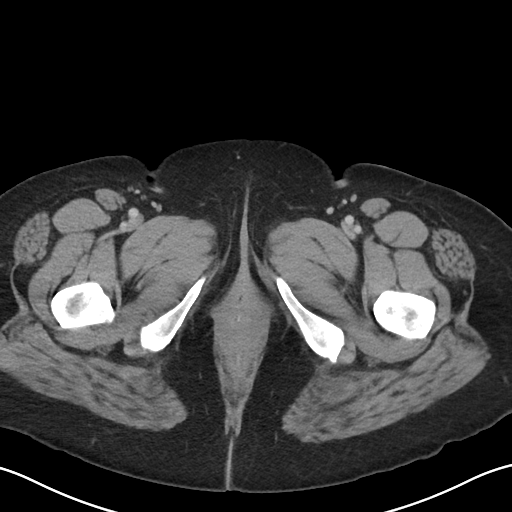
[im 8/105  bone]
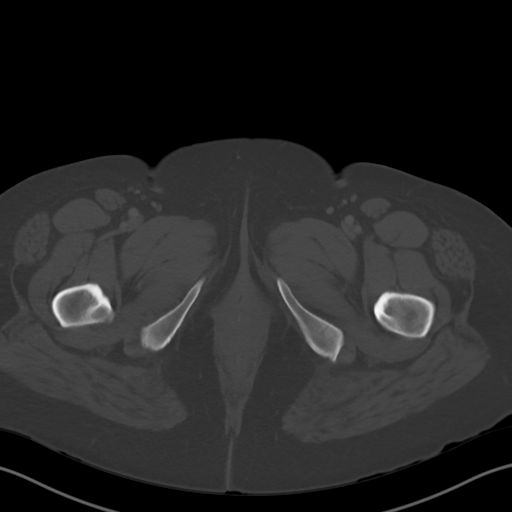
[im 15/105  soft-tissue]
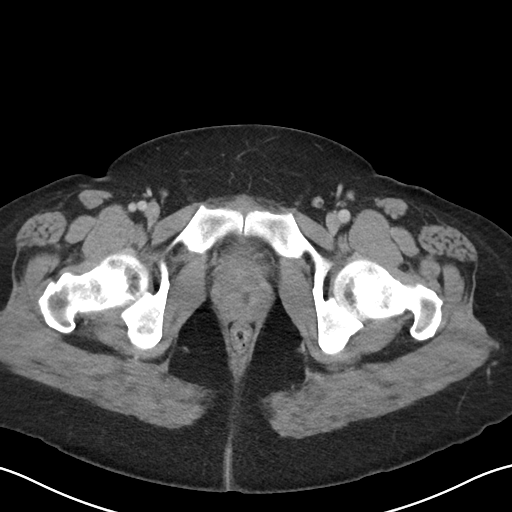
[im 23/105  soft-tissue]
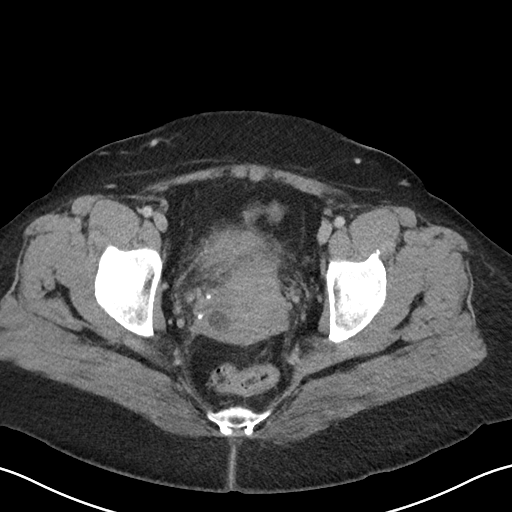
[im 30/105  soft-tissue]
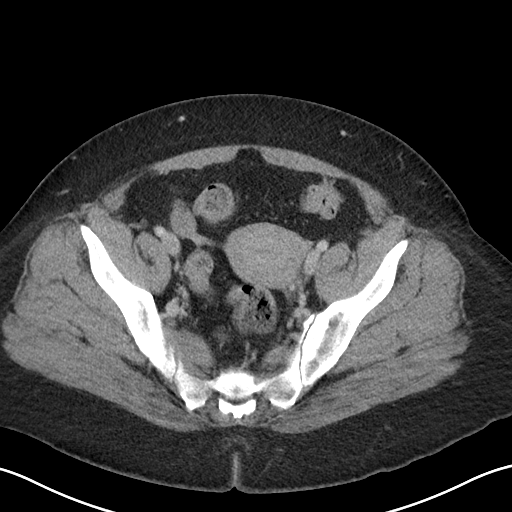
[im 38/105  soft-tissue]
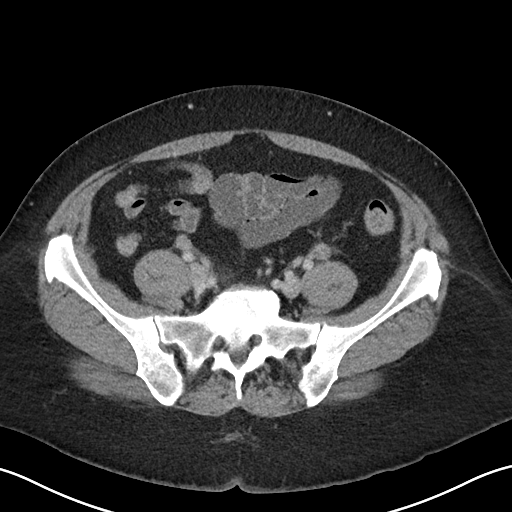
[im 45/105  soft-tissue]
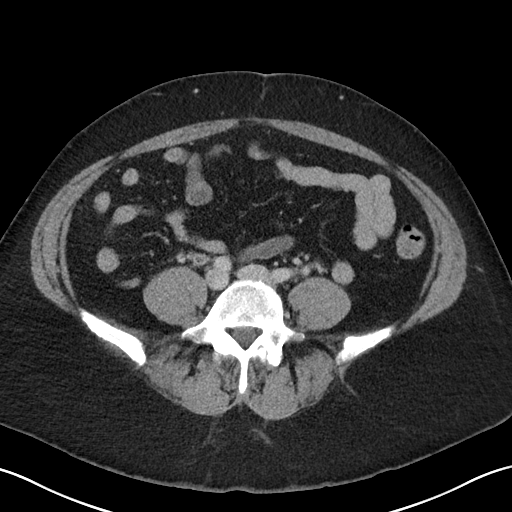
[im 60/105  soft-tissue]
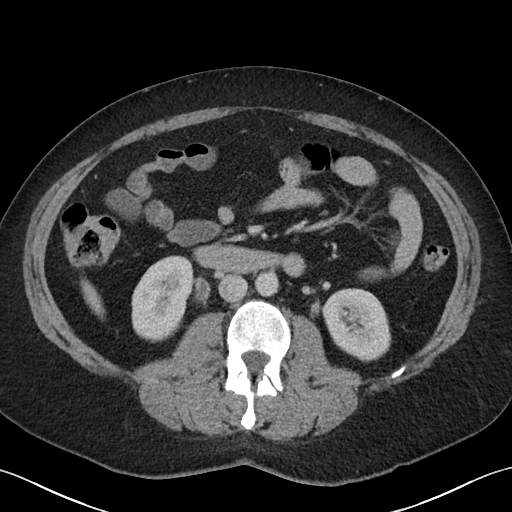
[im 67/105  soft-tissue]
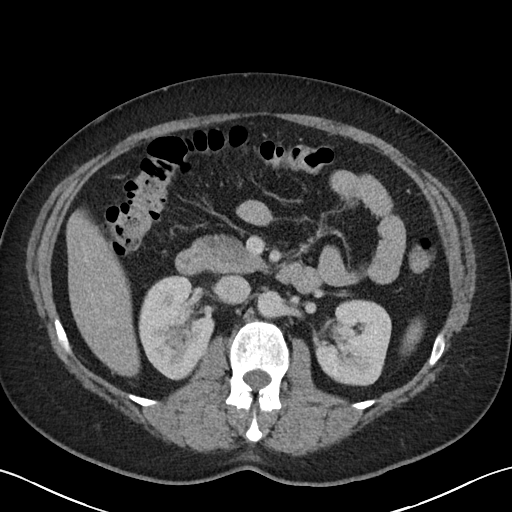
[im 75/105  soft-tissue]
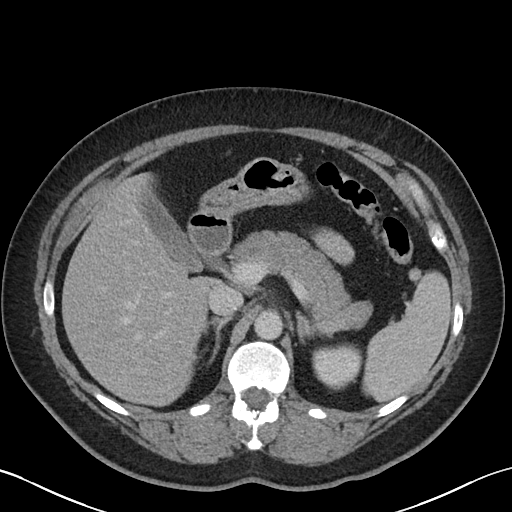
[im 75/105  bone]
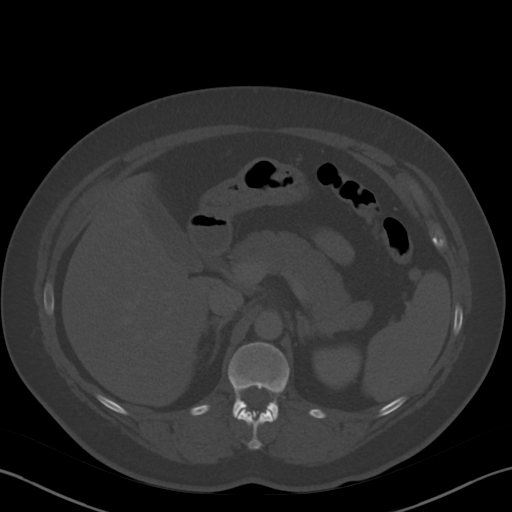
[im 82/105  soft-tissue]
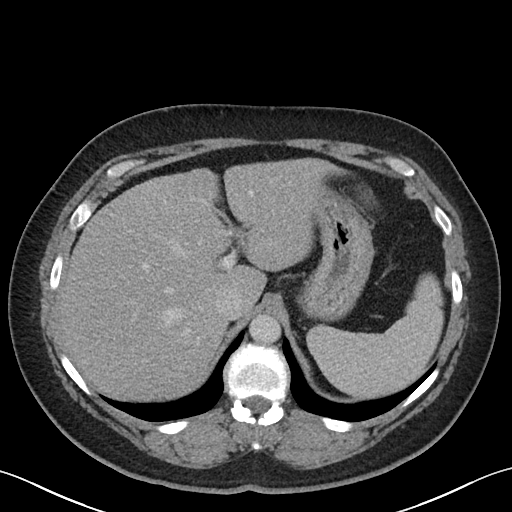
[im 90/105  soft-tissue]
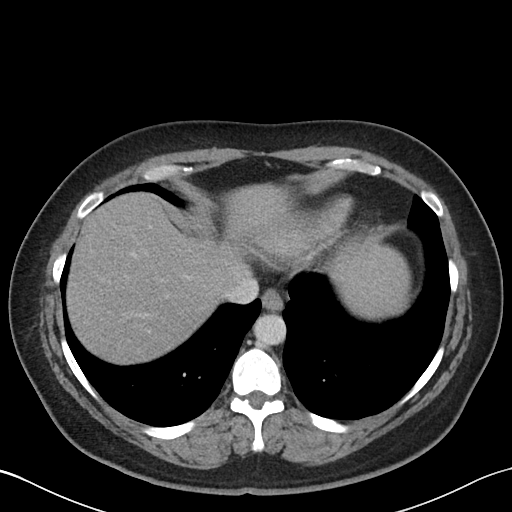
[im 97/105  soft-tissue]
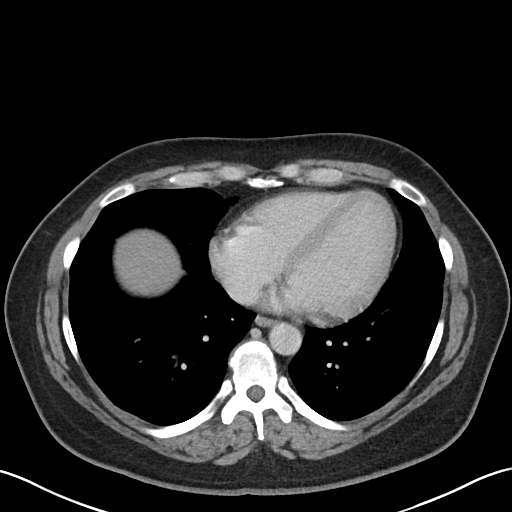

[15 of 46 positions shown; findings below may reference images not displayed]

FINDINGS: Lower chest: No acute abnormality.

Hepatobiliary: Tiny subcentimeter low-density lesion in the left
hepatic lobe is too small to characterize. No other focal liver
abnormality. Gallbladder is unremarkable. No biliary dilatation.

Pancreas: Unremarkable. No pancreatic ductal dilatation or
surrounding inflammatory changes.

Spleen: Normal in size without focal abnormality.  Small splenule.

Adrenals/Urinary Tract: The adrenal glands are unremarkable. The
kidneys are unremarkable. No renal or ureteral calculi. No
hydronephrosis. Urothelial enhancement throughout the entire right
ureter with mild periureteral inflammatory changes along the mid to
distal ureter. Urothelial enhancement and mild periureteral
inflammatory changes along the mid to distal left ureter. Moderate
circumferential bladder wall thickening and enhancement with mild
perivesical inflammatory changes.

Stomach/Bowel: Stomach is within normal limits. Appendix appears
normal. No evidence of bowel wall thickening, distention, or
inflammatory changes.

Vascular/Lymphatic: No significant vascular findings are present. No
enlarged abdominal or pelvic lymph nodes.

Reproductive: Uterus and bilateral adnexa are unremarkable. Dominant
follicle in the right ovary. Nabothian cyst in the cervix.

Other: Tiny fat containing umbilical hernia. No free fluid or
pneumoperitoneum.

Musculoskeletal: No acute or significant osseous findings.
Degenerative changes of the sacroiliac joints.
IMPRESSION: 1. Urothelial enhancement and inflammatory changes surrounding the
bladder and bilateral ureters, consistent with urinary tract
infection. No evidence of pyelonephritis.

## 2020-02-13 ENCOUNTER — Emergency Department (HOSPITAL_COMMUNITY): Payer: Self-pay

## 2020-02-13 ENCOUNTER — Other Ambulatory Visit: Payer: Self-pay

## 2020-02-13 ENCOUNTER — Emergency Department (HOSPITAL_COMMUNITY)
Admission: EM | Admit: 2020-02-13 | Discharge: 2020-02-13 | Disposition: A | Discharge status: Home / Self Care | Payer: Self-pay | Attending: Emergency Medicine | Admitting: Emergency Medicine

## 2020-02-13 ENCOUNTER — Encounter (HOSPITAL_COMMUNITY): Payer: Self-pay | Admitting: Emergency Medicine

## 2020-02-13 DIAGNOSIS — F17210 Nicotine dependence, cigarettes, uncomplicated: Secondary | ICD-10-CM | POA: Insufficient documentation

## 2020-02-13 DIAGNOSIS — N3000 Acute cystitis without hematuria: Secondary | ICD-10-CM | POA: Insufficient documentation

## 2020-02-13 DIAGNOSIS — R109 Unspecified abdominal pain: Secondary | ICD-10-CM

## 2020-02-13 LAB — URINALYSIS, ROUTINE W REFLEX MICROSCOPIC
Bilirubin Urine: NEGATIVE
Glucose, UA: NEGATIVE mg/dL
Ketones, ur: NEGATIVE mg/dL
Nitrite: NEGATIVE
Protein, ur: NEGATIVE mg/dL
Specific Gravity, Urine: 1.015 (ref 1.005–1.030)
WBC, UA: >50 WBC/hpf — ABNORMAL HIGH (ref 0–5)
pH: 6 (ref 5.0–8.0)

## 2020-02-13 LAB — BASIC METABOLIC PANEL
Anion gap: 10 (ref 5–15)
BUN: 9 mg/dL (ref 6–20)
CO2: 24 mmol/L (ref 22–32)
Calcium: 9 mg/dL (ref 8.9–10.3)
Chloride: 104 mmol/L (ref 98–111)
Creatinine, Ser: 0.81 mg/dL (ref 0.44–1.00)
GFR calc Af Amer: >60 mL/min (ref >60)
GFR calc non Af Amer: >60 mL/min (ref >60)
Glucose, Bld: 98 mg/dL (ref 70–99)
Potassium: 4.4 mmol/L (ref 3.5–5.1)
Sodium: 138 mmol/L (ref 135–145)

## 2020-02-13 LAB — CBC
HCT: 39.1 % (ref 36.0–46.0)
Hemoglobin: 12 g/dL (ref 12.0–15.0)
MCH: 27.5 pg (ref 26.0–34.0)
MCHC: 30.7 g/dL (ref 30.0–36.0)
MCV: 89.7 fL (ref 80.0–100.0)
Platelets: 333 10*3/uL (ref 150–400)
RBC: 4.36 MIL/uL (ref 3.87–5.11)
RDW: 14.4 % (ref 11.5–15.5)
WBC: 6.8 10*3/uL (ref 4.0–10.5)
nRBC: 0 % (ref 0.0–0.2)

## 2020-02-13 LAB — I-STAT BETA HCG BLOOD, ED (MC, WL, AP ONLY): I-stat hCG, quantitative: <5 m[IU]/mL (ref <5)

## 2020-02-13 MED ORDER — KETOROLAC TROMETHAMINE 30 MG/ML IJ SOLN
30.0000 mg | Freq: Once | INTRAMUSCULAR | Status: AC
  Administered 2020-02-13: 30 mg via INTRAVENOUS
  Filled 2020-02-13: qty 1

## 2020-02-13 MED ORDER — MORPHINE SULFATE (PF) 4 MG/ML IV SOLN
4.0000 mg | Freq: Once | INTRAVENOUS | Status: AC
  Administered 2020-02-13: 4 mg via INTRAVENOUS
  Filled 2020-02-13: qty 1

## 2020-02-13 MED ORDER — CEPHALEXIN 500 MG PO CAPS: 500.0000 mg | ORAL_CAPSULE | Freq: Four times a day (QID) | ORAL | 0 refills | Status: AC

## 2020-02-13 MED ORDER — ONDANSETRON HCL 4 MG/2ML IJ SOLN
4.0000 mg | Freq: Once | INTRAMUSCULAR | Status: AC
  Administered 2020-02-13: 4 mg via INTRAVENOUS
  Filled 2020-02-13: qty 2

## 2020-02-13 MED ORDER — HYDROMORPHONE HCL 1 MG/ML IJ SOLN
1.0000 mg | Freq: Once | INTRAMUSCULAR | Status: AC
  Administered 2020-02-13: 1 mg via INTRAVENOUS
  Filled 2020-02-13: qty 1

## 2020-02-13 MED ORDER — SODIUM CHLORIDE 0.9 % IV BOLUS
1000.0000 mL | Freq: Once | INTRAVENOUS | Status: AC
  Administered 2020-02-13: 1000 mL via INTRAVENOUS

## 2020-02-13 MED ORDER — SODIUM CHLORIDE 0.9 % IV SOLN
1.0000 g | Freq: Once | INTRAVENOUS | Status: AC
  Administered 2020-02-13: 1 g via INTRAVENOUS
  Filled 2020-02-13: qty 10

## 2020-02-13 MED ORDER — HYDROCODONE-ACETAMINOPHEN 5-325 MG PO TABS: 1.0000 | ORAL_TABLET | ORAL | 0 refills | Status: AC | PRN

## 2020-02-13 NOTE — ED Provider Notes (Signed)
[facility] EMERGENCY DEPARTMENT Provider Note   CSN: [id number] Arrival date & time: 02/13/20  [id number]     History Chief Complaint  Patient presents with  . Flank Pain    [name] is a 44 y.o. female.  Pt presents to the ED today with left sided flank pain.  Pt said it started in the right side of her back a few weeks ago, but has since moved to the left.  She said it is not going away.  She denies any f/c.  No n/v.  She said it hurts with movement and to touch.  She is able to walk.  She denies incontinence.  She is on her period now.        Past Medical History:  Diagnosis Date  . Back pain, chronic   . Bipolar disorder ([place])   . Neck pain, chronic   . Opioid dependence [facility])     Patient Active Problem List   Diagnosis Date Noted  . NEVUS 05/08/2009  . BIPOLAR DISORDER UNSPECIFIED 05/08/2009  . OPIOID TYPE DEPENDENCE CONTINUOUS ABUSE 05/08/2009  . TOBACCO USE 05/08/2009  . SINUS BRADYCARDIA 05/08/2009  . DIABETES MELLITUS-GESTATIONAL-CONTROLLED 05/08/2009  . 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 NECK PAIN, CHRONIC 05/08/2009  . BACK PAIN, CHRONIC 05/08/2009  . HEADACHE, CHRONIC 05/08/2009  . HEART MURMUR, BENIGN 05/08/2009    Past Surgical History:  Procedure Laterality Date  . NECK SURGERY    . Postlaminectomy syndrome cervical region    . TUBAL LIGATION       OB History   No obstetric history on file.     Family History  Problem Relation Age of Onset  . Cancer Mother 53       living currently  . Cancer Father 72       small cell lung cancer(smoker)  . Bradycardia Sister        2 half sisters with bradycardia    Social History   Tobacco Use  . Smoking status: Current Every Day Smoker    Packs/day: 0.50    Years: 18.00    Pack years: 9.00  . Smokeless tobacco: Never Used  Substance Use Topics  . Alcohol use: Yes  . Drug use: No    Home Medications Prior to Admission medications   Medication Sig  Start Date End Date Taking? Authorizing Provider  cephALEXin (KEFLEX) 500 MG capsule Take 1 capsule (500 mg total) by mouth 4 (four) times daily. 02/13/20   Isla Pence, MD  HYDROcodone-acetaminophen (NORCO/VICODIN) 5-325 MG tablet Take 1 tablet by mouth every 4 (four) hours as needed. 02/13/20   Isla Pence, MD  ibuprofen (ADVIL,MOTRIN) 200 MG tablet Take 800 mg by mouth every 6 (six) hours as needed for mild pain.    [provider]  methocarbamol (ROBAXIN) 500 MG tablet Can take up to 1-2 tabs every 6 hours PRN PAIN Patient not taking: Reported on 11/28/2018 09/20/16   Pisciotta, Elmyra Ricks, PA-C  metroNIDAZOLE (FLAGYL) 500 MG tablet Take 1 tablet (500 mg total) by mouth 2 (two) times daily. Patient not taking: Reported on 11/28/2018 02/15/18   Hedges, Dellis Filbert, PA-C  ondansetron (ZOFRAN) 4 MG tablet Take 1 tablet (4 mg total) by mouth every 8 (eight) hours as needed for nausea or vomiting. 11/28/18   Drenda Freeze, MD    Allergies    Patient has no known allergies.  Review of Systems   Review of Systems  Genitourinary: Positive for flank pain.  Musculoskeletal:  Positive for back pain.  All other systems reviewed and are negative.   Physical Exam Updated Vital Signs BP (!) 151/110   Pulse 73   Temp 98.4 F (36.9 C) (Oral)   Resp 18   Ht 5\' 6"  (1.676 m)   Wt 99.8 kg   SpO2 96%   BMI 35.51 kg/m   Physical Exam Vitals and nursing note reviewed.  Constitutional:      Appearance: Normal appearance.  HENT:     Head: Normocephalic and atraumatic.     Right Ear: External ear normal.     Left Ear: External ear normal.     Nose: Nose normal.     Mouth/Throat:     Mouth: Mucous membranes are moist.     Pharynx: Oropharynx is clear.  Eyes:     Conjunctiva/sclera: Conjunctivae normal.     Pupils: Pupils are equal, round, and reactive to light.  Cardiovascular:     Rate and Rhythm: Normal rate and regular rhythm.     Pulses: Normal pulses.     Heart sounds: Normal  heart sounds.  Pulmonary:     Effort: Pulmonary effort is normal.     Breath sounds: Normal breath sounds.  Abdominal:     General: Abdomen is flat. Bowel sounds are normal.     Palpations: Abdomen is soft.  Musculoskeletal:        General: Normal range of motion.     Cervical back: Normal range of motion and neck supple.       Back:  Skin:    General: Skin is warm.     Capillary Refill: Capillary refill takes less than 2 seconds.  Neurological:     General: No focal deficit present.     Mental Status: She is alert and oriented to person, place, and time.  Psychiatric:        Mood and Affect: Mood normal.        Behavior: Behavior normal.     ED Results / Procedures / Treatments   Labs (all labs ordered are listed, but only abnormal results are displayed) Labs Reviewed  URINALYSIS, ROUTINE W REFLEX MICROSCOPIC - Abnormal; Notable for the following components:      Result Value   APPearance HAZY (*)    Hgb urine dipstick SMALL (*)    Leukocytes,Ua LARGE (*)    WBC, UA >50 (*)    Bacteria, UA MANY (*)    All other components within normal limits  BASIC METABOLIC PANEL  CBC  I-STAT BETA HCG BLOOD, ED ([facility], [facility], [facility] ONLY)    EKG None  Radiology CT Renal Stone Study  Result Date: 02/13/2020 CLINICAL DATA:  Left flank pain EXAM: CT ABDOMEN AND PELVIS WITHOUT CONTRAST TECHNIQUE: Multidetector CT imaging of the abdomen and pelvis was performed following the standard protocol without oral or IV contrast. COMPARISON:  November 28, 2018 FINDINGS: Lower chest: Lung bases are clear. Hepatobiliary: There is hepatic steatosis. No focal liver lesions are evident on this noncontrast enhanced study. Gallbladder wall is not appreciably thickened. There is no biliary duct dilatation. Pancreas: There is no pancreatic mass or inflammatory focus. Spleen: No splenic lesions are evident. Adrenals/Urinary Tract: There is a right adrenal adenoma measuring 1.5 x 1.2 cm. Left adrenal appears normal.  There is no evident renal mass or hydronephrosis on either side. There is no evident renal or ureteral calculus on either side. Urinary bladder is midline with wall thickness within normal limits. Stomach/Bowel: There is no appreciable bowel  wall or mesenteric thickening. There is no appreciable bowel obstruction. The terminal ileum appears normal. No evident free air or portal venous air. Vascular/Lymphatic: There is no abdominal aortic aneurysm. No vascular lesions are evident on this noncontrast enhanced study. There is no evident adenopathy in the abdomen or pelvis. Reproductive: Uterus is anteverted. There is an apparent nabothian cyst arising from the rightward aspect of the cervix measuring 1.6 x 1.6 cm. Tampon in place. Beyond the cervical nabothian cyst, there is no evident pelvic mass. Other: Appendix appears normal. No abscess or ascites is evident in the abdomen or pelvis. There is slight fat in the umbilicus. Musculoskeletal: There is degenerative change in the lumbar spine. No blastic or lytic bone lesions. No intramuscular lesions are evident. IMPRESSION: 1. No renal or ureteral calculus. No hydronephrosis. Urinary bladder wall thickness normal. 2.  Benign right adrenal adenoma measuring 1.5 x 1.2 cm. 3. No bowel wall thickening or bowel obstruction. No abscess in the abdomen or pelvis. Appendix appears normal. 4. 1.6 cm cervical nabothian cyst along the rightward aspect of cervix. 5.  Hepatic steatosis. Electronically Signed   By: Lowella Grip III M.D.   On: 02/13/2020 09:52    Procedures Procedures (including critical care time)  Medications Ordered in ED Medications  sodium chloride 0.9 % bolus 1,000 mL (1,000 mLs Intravenous New Bag/Given 02/13/20 0820)  ondansetron (ZOFRAN) injection 4 mg (4 mg Intravenous Given 02/13/20 0820)  ketorolac (TORADOL) 30 MG/ML injection 30 mg (30 mg Intravenous Given 02/13/20 0820)  morphine 4 MG/ML injection 4 mg (4 mg Intravenous Given 02/13/20 0854)   cefTRIAXone (ROCEPHIN) 1 g in sodium chloride 0.9 % 100 mL IVPB (1 g Intravenous New Bag/Given 02/13/20 0915)  HYDROmorphone (DILAUDID) injection 1 mg (1 mg Intravenous Given 02/13/20 0944)    ED Course  I have reviewed the triage vital signs and the nursing notes.  Pertinent labs & imaging results that were available during my care of the patient were reviewed by me and considered in my medical decision making (see chart for details).    MDM Rules/Calculators/A&P                      Pt is feeling much better after pain meds and fluids.  She does have a UTI for which she was given 1g rocephin in ED.  She does not have a kidney stone.  She is d/c with keflex and lortab.  Increase water intake.  Return if worse.   Final Clinical Impression(s) / ED Diagnoses Final diagnoses:  Acute cystitis without hematuria  Flank pain    Rx / DC Orders ED Discharge Orders         Ordered    cephALEXin (KEFLEX) 500 MG capsule  4 times daily     02/13/20 1008    HYDROcodone-acetaminophen (NORCO/VICODIN) 5-325 MG tablet  Every 4 hours PRN     02/13/20 1008           Isla Pence, MD 02/13/20 1009

## 2020-02-13 NOTE — ED Triage Notes (Signed)
Pt here POV with co L sided pain that radiates to the back. Pt states it started a couple days ago and got unbearable last night and morning. Last took tylenol at 10pm last night. Pt states she has never had a kidney stone and doesn't drink water. Pain 10/10

## 2020-02-14 ENCOUNTER — Emergency Department (HOSPITAL_COMMUNITY)
Admission: EM | Admit: 2020-02-14 | Discharge: 2020-02-14 | Discharge status: Against Medical Advice | Payer: Self-pay | Attending: Emergency Medicine | Admitting: Emergency Medicine

## 2020-02-14 DIAGNOSIS — T7840XA Allergy, unspecified, initial encounter: Secondary | ICD-10-CM

## 2020-02-14 DIAGNOSIS — T361X5A Adverse effect of cephalosporins and other beta-lactam antibiotics, initial encounter: Secondary | ICD-10-CM | POA: Insufficient documentation

## 2020-02-14 DIAGNOSIS — F17210 Nicotine dependence, cigarettes, uncomplicated: Secondary | ICD-10-CM | POA: Insufficient documentation

## 2020-02-14 DIAGNOSIS — L500 Allergic urticaria: Secondary | ICD-10-CM | POA: Insufficient documentation

## 2020-02-14 NOTE — ED Notes (Signed)
Pt stated that she felt fine and did not feel like she needed to be seen. Pt would like to leave before MD comes in room.

## 2020-02-14 NOTE — ED Triage Notes (Addendum)
Patient arrived by EMS from home. Patient began having redness, itchy throat, and hives after cephalexin antibiotic. EMS established IV and gave benadryl 50mg  and EPI.   Patient wants to leave upon arrival. Patient states "I'm fine and I don't need to be seen".   Patient appears stable and ambulatory. Patient has no breathing difficulty.   This writer was unable to complete triage. Patient was evaluated by MD Rogene Houston and told the risk associated with this allergic reaction.   Patient ambulated out of ED with a steady gate. Patient left AMA.

## 2020-02-14 NOTE — ED Provider Notes (Signed)
[name] DEPT Provider Note   CSN: [id number] Arrival date & time: 02/14/20  1121     History No chief complaint on file.   [name] is a 44 y.o. female.  Patient brought in by EMS from home.  Patient with redness itchy throat and hives after Keflex antibiotic.  Patient also given Rocephin in the emergency department this yesterday morning.  EMS established IV gave Benadryl 50 mg and gave epinephrine.  Patient arrives here wanting to leave.  Patient talking okay with skin is beet red.  Past medical history significant for bipolar disorder and a new diagnosis of urinary tract infection.  Chronic back pain chronic neck pain and opioid dependence.  Patient insisted on leaving.  Went through potential life-threatening ramifications of leaving at this point tickly when the epinephrine wears off.  Patient still insisted on leaving so she will sign out AMA.  Patient just seen yesterday at [facility] emergency department for flank pain.  With a diagnosis of urinary tract infection.  Given Rocephin and started on Keflex        Past Medical History:  Diagnosis Date  . Back pain, chronic   . Bipolar disorder ([place])   . Neck pain, chronic   . Opioid dependence [facility])     Patient Active Problem List   Diagnosis Date Noted  . NEVUS 05/08/2009  . BIPOLAR DISORDER UNSPECIFIED 05/08/2009  . OPIOID TYPE DEPENDENCE CONTINUOUS ABUSE 05/08/2009  . TOBACCO USE 05/08/2009  . SINUS BRADYCARDIA 05/08/2009  . DIABETES MELLITUS-GESTATIONAL-CONTROLLED 05/08/2009  . 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 NECK PAIN, CHRONIC 05/08/2009  . BACK PAIN, CHRONIC 05/08/2009  . HEADACHE, CHRONIC 05/08/2009  . HEART MURMUR, BENIGN 05/08/2009    Past Surgical History:  Procedure Laterality Date  . NECK SURGERY    . Postlaminectomy syndrome cervical region    . TUBAL LIGATION       OB History   No obstetric history on file.     Family History  Problem  Relation Age of Onset  . Cancer Mother 32       living currently  . Cancer Father 30       small cell lung cancer(smoker)  . Bradycardia Sister        2 half sisters with bradycardia    Social History   Tobacco Use  . Smoking status: Current Every Day Smoker    Packs/day: 0.50    Years: 18.00    Pack years: 9.00  . Smokeless tobacco: Never Used  Substance Use Topics  . Alcohol use: Yes  . Drug use: No    Home Medications Prior to Admission medications   Medication Sig Start Date End Date Taking? Authorizing Provider  cephALEXin (KEFLEX) 500 MG capsule Take 1 capsule (500 mg total) by mouth 4 (four) times daily. 02/13/20   Isla Pence, MD  HYDROcodone-acetaminophen (NORCO/VICODIN) 5-325 MG tablet Take 1 tablet by mouth every 4 (four) hours as needed. 02/13/20   Isla Pence, MD  ibuprofen (ADVIL,MOTRIN) 200 MG tablet Take 800 mg by mouth every 6 (six) hours as needed for mild pain.    [provider]  methocarbamol (ROBAXIN) 500 MG tablet Can take up to 1-2 tabs every 6 hours PRN PAIN Patient not taking: Reported on 11/28/2018 09/20/16   Pisciotta, Elmyra Ricks, PA-C  metroNIDAZOLE (FLAGYL) 500 MG tablet Take 1 tablet (500 mg total) by mouth 2 (two) times daily. Patient not taking: Reported on 11/28/2018 02/15/18   Okey Regal, PA-C  ondansetron (ZOFRAN) 4 MG tablet Take 1 tablet (4 mg total) by mouth every 8 (eight) hours as needed for nausea or vomiting. 11/28/18   Drenda Freeze, MD    Allergies    Patient has no known allergies.  Review of Systems   Review of Systems  Constitutional: Negative for chills and fever.  HENT: Negative for rhinorrhea and sore throat.   Eyes: Negative for visual disturbance.  Respiratory: Negative for cough and shortness of breath.   Cardiovascular: Negative for chest pain and leg swelling.  Gastrointestinal: Negative for abdominal pain, diarrhea, nausea and vomiting.  Genitourinary: Negative for dysuria.  Musculoskeletal:  Negative for back pain and neck pain.  Skin: Positive for rash.  Neurological: Negative for dizziness, light-headedness and headaches.  Hematological: Does not bruise/bleed easily.  Psychiatric/Behavioral: Negative for confusion.    Physical Exam Updated Vital Signs SpO2 98%   Physical Exam Skin:    Findings: Erythema and rash present.  Neurological:     Mental Status: She is oriented to person, place, and time.     ED Results / Procedures / Treatments   Labs (all labs ordered are listed, but only abnormal results are displayed) Labs Reviewed - No data to display  EKG None  Radiology CT Renal Stone Study  Result Date: 02/13/2020 CLINICAL DATA:  Left flank pain EXAM: CT ABDOMEN AND PELVIS WITHOUT CONTRAST TECHNIQUE: Multidetector CT imaging of the abdomen and pelvis was performed following the standard protocol without oral or IV contrast. COMPARISON:  November 28, 2018 FINDINGS: Lower chest: Lung bases are clear. Hepatobiliary: There is hepatic steatosis. No focal liver lesions are evident on this noncontrast enhanced study. Gallbladder wall is not appreciably thickened. There is no biliary duct dilatation. Pancreas: There is no pancreatic mass or inflammatory focus. Spleen: No splenic lesions are evident. Adrenals/Urinary Tract: There is a right adrenal adenoma measuring 1.5 x 1.2 cm. Left adrenal appears normal. There is no evident renal mass or hydronephrosis on either side. There is no evident renal or ureteral calculus on either side. Urinary bladder is midline with wall thickness within normal limits. Stomach/Bowel: There is no appreciable bowel wall or mesenteric thickening. There is no appreciable bowel obstruction. The terminal ileum appears normal. No evident free air or portal venous air. Vascular/Lymphatic: There is no abdominal aortic aneurysm. No vascular lesions are evident on this noncontrast enhanced study. There is no evident adenopathy in the abdomen or pelvis.  Reproductive: Uterus is anteverted. There is an apparent nabothian cyst arising from the rightward aspect of the cervix measuring 1.6 x 1.6 cm. Tampon in place. Beyond the cervical nabothian cyst, there is no evident pelvic mass. Other: Appendix appears normal. No abscess or ascites is evident in the abdomen or pelvis. There is slight fat in the umbilicus. Musculoskeletal: There is degenerative change in the lumbar spine. No blastic or lytic bone lesions. No intramuscular lesions are evident. IMPRESSION: 1. No renal or ureteral calculus. No hydronephrosis. Urinary bladder wall thickness normal. 2.  Benign right adrenal adenoma measuring 1.5 x 1.2 cm. 3. No bowel wall thickening or bowel obstruction. No abscess in the abdomen or pelvis. Appendix appears normal. 4. 1.6 cm cervical nabothian cyst along the rightward aspect of cervix. 5.  Hepatic steatosis. Electronically Signed   By: Lowella Grip III M.D.   On: 02/13/2020 09:52    Procedures Procedures (including critical care time)  Medications Ordered in ED Medications - No data to display  ED Course  I have reviewed the triage  vital signs and the nursing notes.  Pertinent labs & imaging results that were available during my care of the patient were reviewed by me and considered in my medical decision making (see chart for details).    MDM Rules/Calculators/A&P                      Patient hives at this time.  Counseled patient on potential concerns for leaving at this point time to go when the epinephrine wears off due to the allergic reaction.  Patient talking in full sentences.  Skin very erythematous.  Patient counseled to at least stop the Keflex.  But that leaves her with no antibiotic to supplement.  Patient would not allow physical exam. Final Clinical Impression(s) / ED Diagnoses Final diagnoses:  Allergic reaction, initial encounter    Rx / DC Orders ED Discharge Orders    None       Fredia Sorrow, MD 02/14/20 1219

## 2020-05-02 ENCOUNTER — Other Ambulatory Visit: Payer: Self-pay

## 2020-05-02 ENCOUNTER — Emergency Department (HOSPITAL_COMMUNITY)
Admission: EM | Admit: 2020-05-02 | Discharge: 2020-05-02 | Disposition: A | Discharge status: Home / Self Care | Payer: Self-pay | Attending: Emergency Medicine | Admitting: Emergency Medicine

## 2020-05-02 ENCOUNTER — Encounter (HOSPITAL_COMMUNITY): Payer: Self-pay

## 2020-05-02 DIAGNOSIS — M62838 Other muscle spasm: Secondary | ICD-10-CM | POA: Insufficient documentation

## 2020-05-02 DIAGNOSIS — R1031 Right lower quadrant pain: Secondary | ICD-10-CM | POA: Insufficient documentation

## 2020-05-02 DIAGNOSIS — T7840XA Allergy, unspecified, initial encounter: Secondary | ICD-10-CM | POA: Insufficient documentation

## 2020-05-02 DIAGNOSIS — F17210 Nicotine dependence, cigarettes, uncomplicated: Secondary | ICD-10-CM | POA: Insufficient documentation

## 2020-05-02 LAB — CBC WITH DIFFERENTIAL/PLATELET
Abs Immature Granulocytes: 0.04 10*3/uL (ref 0.00–0.07)
Basophils Absolute: 0 10*3/uL (ref 0.0–0.1)
Basophils Relative: 0 %
Eosinophils Absolute: 0 10*3/uL (ref 0.0–0.5)
Eosinophils Relative: 1 %
HCT: 42.2 % (ref 36.0–46.0)
Hemoglobin: 13.1 g/dL (ref 12.0–15.0)
Immature Granulocytes: 1 %
Lymphocytes Relative: 16 %
Lymphs Abs: 1.2 10*3/uL (ref 0.7–4.0)
MCH: 27 pg (ref 26.0–34.0)
MCHC: 31 g/dL (ref 30.0–36.0)
MCV: 86.8 fL (ref 80.0–100.0)
Monocytes Absolute: 0.5 10*3/uL (ref 0.1–1.0)
Monocytes Relative: 6 %
Neutro Abs: 5.5 10*3/uL (ref 1.7–7.7)
Neutrophils Relative %: 76 %
Platelets: 284 10*3/uL (ref 150–400)
RBC: 4.86 MIL/uL (ref 3.87–5.11)
RDW: 15.9 % — ABNORMAL HIGH (ref 11.5–15.5)
WBC: 7.2 10*3/uL (ref 4.0–10.5)
nRBC: 0 % (ref 0.0–0.2)

## 2020-05-02 LAB — COMPREHENSIVE METABOLIC PANEL
ALT: 66 U/L — ABNORMAL HIGH (ref 0–44)
AST: 60 U/L — ABNORMAL HIGH (ref 15–41)
Albumin: 3.9 g/dL (ref 3.5–5.0)
Alkaline Phosphatase: 74 U/L (ref 38–126)
Anion gap: 14 (ref 5–15)
BUN: 11 mg/dL (ref 6–20)
CO2: 19 mmol/L — ABNORMAL LOW (ref 22–32)
Calcium: 9.1 mg/dL (ref 8.9–10.3)
Chloride: 105 mmol/L (ref 98–111)
Creatinine, Ser: 0.96 mg/dL (ref 0.44–1.00)
GFR calc Af Amer: >60 mL/min (ref >60)
GFR calc non Af Amer: >60 mL/min (ref >60)
Glucose, Bld: 185 mg/dL — ABNORMAL HIGH (ref 70–99)
Potassium: 3.5 mmol/L (ref 3.5–5.1)
Sodium: 138 mmol/L (ref 135–145)
Total Bilirubin: 0.9 mg/dL (ref 0.3–1.2)
Total Protein: 6.8 g/dL (ref 6.5–8.1)

## 2020-05-02 LAB — URINALYSIS, ROUTINE W REFLEX MICROSCOPIC
Bacteria, UA: NONE SEEN
Bilirubin Urine: NEGATIVE
Glucose, UA: NEGATIVE mg/dL
Ketones, ur: 5 mg/dL — AB
Leukocytes,Ua: NEGATIVE
Nitrite: NEGATIVE
Protein, ur: 30 mg/dL — AB
Specific Gravity, Urine: 1.028 (ref 1.005–1.030)
pH: 5 (ref 5.0–8.0)

## 2020-05-02 MED ORDER — KETOROLAC TROMETHAMINE 30 MG/ML IJ SOLN
30.0000 mg | Freq: Once | INTRAMUSCULAR | Status: AC
  Administered 2020-05-02: 30 mg via INTRAVENOUS
  Filled 2020-05-02: qty 1

## 2020-05-02 MED ORDER — METHOCARBAMOL 500 MG PO TABS: 500.0000 mg | ORAL_TABLET | Freq: Two times a day (BID) | ORAL | 0 refills | Status: AC | PRN

## 2020-05-02 MED ORDER — EPINEPHRINE 0.3 MG/0.3ML IJ SOAJ: 0.3000 mg | INTRAMUSCULAR | 0 refills | Status: AC | PRN

## 2020-05-02 MED ORDER — FENTANYL CITRATE (PF) 100 MCG/2ML IJ SOLN
50.0000 ug | Freq: Once | INTRAMUSCULAR | Status: AC
  Administered 2020-05-02: 50 ug via INTRAVENOUS
  Filled 2020-05-02: qty 2

## 2020-05-02 NOTE — ED Notes (Signed)
Patient verbalizes understanding of discharge instructions. Opportunity for questioning and answers were provided. Armband removed by staff, pt discharged from ED. Pt. ambulatory and discharged home.  

## 2020-05-02 NOTE — Discharge Instructions (Signed)
You were given a prescription for Robaxin which is a muscle relaxer.  You should not drive, work, or operate machinery while taking this medication as it can make you very drowsy.    You were give an epi pen as well. If you experience symptoms of anaphylaxis use this medication and report to the emergency department immediately.  Please follow up with your primary care provider within 5-7 days for re-evaluation of your symptoms. If you do not have a primary care provider, information for a healthcare clinic has been provided for you to make arrangements for follow up care. Please return to the emergency department for any new or worsening symptoms.

## 2020-05-02 NOTE — ED Triage Notes (Signed)
Pt from home via ems; pt has had a kidney infection x 1 month; still having R back pain; pt took leftover antibiotics from a previous infection this afternoon, which turned out to be keflex,which pt is allergic to (pt not aware medication was keflex because she says "previously she had a reaction to a red pill, however these pills were green". Approx 30 minutes after ingesting pills, pt's throat became tight, skin red, broke out in hives, itching, hot; on ems arrival, mild wheezing auscultated; 0.3 mg epi given in L arm; 50 mg PO benadryl, 50 mcg fentanyl given PTA; pt continues to have servere low right back pain; pt speaking in full sentences on arrival, no respiratory distress  160/100 HR 95 sinus 96% RA RR 20

## 2020-05-02 NOTE — ED Provider Notes (Signed)
[facility] EMERGENCY DEPARTMENT Provider Note   CSN: [id number] Arrival date & time: 05/02/20  1535     History Chief Complaint  Patient presents with   Allergic Reaction    [name] is a 44 y.o. female.  HPI   Pt is a 44 y/o female with a h/o chronic back pain, bipolar disorder, chronic neck pain, opioid dependance, who presents to the ED today for eval of an allergic reaction. She states that she has had right flank pain for several months and was diagnosed with a kidney infection. States that she was given an rx for keflex for this back in April. States she took one dose then and had an allergic rxn to it. She never got a different abx. She states she has continued to have pain and took another dose of keflex today. Shortly after she developed urticaria, left ear swelling, a fullness/tightness in her throat and some SOB.   She called EMS and on their eval she was noted to have wheezing bilaterally. She was given epi injection and 50 mg benadryl. She was also given 32m mcg fentanyl for the flank pain.   States since then she has felt improvement in the throat tightness and hives. She denies any angioedema.   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     Family History  Problem Relation Age of Onset   Cancer Mother 66       living currently   Cancer Father 30       small cell lung cancer(smoker)   Bradycardia Sister        2 half sisters with bradycardia    Social History   Tobacco Use   Smoking status: Current Every Day Smoker    Packs/day: 0.50    Years: 18.00    Pack years: 9.00   Smokeless tobacco: Never Used  Substance Use Topics   Alcohol use: Yes   Drug use: No    Home Medications Prior to Admission medications   Medication Sig Start Date End Date Taking? Authorizing Provider  cephALEXin (KEFLEX) 500 MG capsule Take 1 capsule (500 mg total) by mouth 4 (four) times daily. 02/13/20   Isla Pence, MD  EPINEPHrine 0.3 mg/0.3 mL IJ SOAJ injection Inject 0.3 mLs (0.3 mg total) into the muscle as needed for anaphylaxis. 05/02/20   Alfreida Steffenhagen S, PA-C  HYDROcodone-acetaminophen (NORCO/VICODIN) 5-325 MG tablet Take 1 tablet by mouth every 4 (four) hours as needed. 02/13/20   Isla Pence, MD  ibuprofen (ADVIL,MOTRIN) 200 MG tablet Take 800 mg by mouth every 6 (six) hours as needed for mild pain.    [provider]  methocarbamol (ROBAXIN) 500 MG tablet Take 1 tablet (500 mg total) by mouth every 12 (twelve) hours as needed for muscle spasms. Do not drive, work, operate heavy machinery, or take care of children while taking this medication as it can be sedating. 05/02/20   Mozel Burdett S, PA-C  metroNIDAZOLE (FLAGYL) 500 MG tablet Take 1 tablet (500 mg total) by mouth 2 (two) times daily. Patient not taking: Reported on 11/28/2018 02/15/18   Hedges, Dellis Filbert, PA-C  ondansetron (ZOFRAN) 4 MG tablet Take 1 tablet (4 mg total) by mouth every 8 (eight) hours as needed for nausea or vomiting. 11/28/18   Drenda Freeze, MD    Allergies    Keflex [cephalexin]  Review of Systems   Review of Systems  Constitutional: Negative for fever.  HENT: Positive for  facial swelling (ear). Negative for trouble swallowing and voice change.        Throat tightness  Eyes: Negative for visual disturbance.  Respiratory: Positive for shortness of breath and wheezing. Negative for cough.   Cardiovascular: Negative for chest pain.  Gastrointestinal: Negative for abdominal pain, constipation, diarrhea, nausea and vomiting.  Genitourinary: Positive for flank pain. Negative for dysuria and hematuria.  Musculoskeletal: Negative for arthralgias and back pain.  Skin: Positive for rash (resolved). Negative for color change.  Neurological: Negative for seizures and syncope.  All other systems reviewed and are negative.   Physical Exam Updated Vital Signs BP (!) 135/94    Pulse 73    Temp 98.2 F (36.8 C) (Oral)    Resp 14    SpO2 96%   Physical Exam Vitals and nursing note reviewed.  Constitutional:      General: She is not in acute distress.    Appearance: She is well-developed.  HENT:     Head: Normocephalic and atraumatic.  Eyes:     Conjunctiva/sclera: Conjunctivae normal.  Cardiovascular:     Rate and Rhythm: Normal rate and regular rhythm.     Heart sounds: Normal heart sounds. No murmur heard.   Pulmonary:     Effort: Pulmonary effort is normal. No respiratory distress.     Breath sounds: Normal breath sounds. No wheezing or rales.  Chest:     Chest wall: No tenderness.  Abdominal:     General: Bowel sounds are normal.     Palpations: Abdomen is soft.     Tenderness: There is no abdominal tenderness. There is no right CVA tenderness, left CVA tenderness, guarding or rebound.  Musculoskeletal:     Cervical back: Neck supple.     Comments: TTP to the right thoracic paraspinous muscles which reproduces her pain  Skin:    General: Skin is warm and dry.  Neurological:     Mental Status: She is alert.     ED Results / Procedures / Treatments   Labs (all labs ordered are listed, but only abnormal results are displayed) Labs Reviewed  CBC  WITH DIFFERENTIAL/PLATELET - Abnormal; Notable for the following components:      Result Value   RDW 15.9 (*)    All other components within normal limits  COMPREHENSIVE METABOLIC PANEL - Abnormal; Notable for the following components:   CO2 19 (*)    Glucose, Bld 185 (*)    AST 60 (*)    ALT 66 (*)    All other components within normal limits  URINALYSIS, ROUTINE W REFLEX MICROSCOPIC - Abnormal; Notable for the following components:   Hgb urine dipstick SMALL (*)  Ketones, ur 5 (*)    Protein, ur 30 (*)    All other components within normal limits  URINE CULTURE    EKG None  Radiology No results found.  Procedures Procedures (including critical care time)  Medications Ordered in ED Medications  ketorolac (TORADOL) 30 MG/ML injection 30 mg (30 mg Intravenous Given 05/02/20 1618)  fentaNYL (SUBLIMAZE) injection 50 mcg (50 mcg Intravenous Given 05/02/20 1717)    ED Course  I have reviewed the triage vital signs and the nursing notes.  Pertinent labs & imaging results that were available during my care of the patient were reviewed by me and considered in my medical decision making (see chart for details).    MDM Rules/Calculators/A&P                          44 year old female post patient diagnosed with pyelonephritis several months ago and got Rx for antibiotics at that time.  The following day when she took it she had a anaphylactic reaction and was seen in the ED.  States that she has continued to have right flank pain so took 1 dose of the antibiotic again and had recurrence of anaphylaxis.  She was given epi with EMS as well as Benadryl.  Upon my assessment her symptoms have resolved other than the right flank pain.  On exam she has tenderness to the right thoracic paraspinous muscles which reproduces her symptoms.  She has no CVA tenderness on exam.  Her urinalysis is negative and her labs are reassuring.  Reviewed prior records and prior CT scan done in April and she  had no evidence of nephrolithiasis at that time so I doubt stone.  I believe her symptoms are related to a muscle spasm.  She had improvement of her symptoms with fentanyl.  I will give Rx for Robaxin for home.  We will also give Rx for EpiPen.  She is requesting to be discharged prior to the completion of her 4-hour observation.  I discussed the risks of recurrence of symptoms however she still wants to be discharged.  She does voice understanding of the risks.  Advised her to follow-up with PCP in regards to her symptoms and return to the ED for new or worsening symptoms.  She voices understanding of the plan and reasons to return.  All questions answered.  Patient stable for discharge.  Final Clinical Impression(s) / ED Diagnoses Final diagnoses:  Allergic reaction, initial encounter  Muscle spasm    Rx / DC Orders ED Discharge Orders         Ordered    EPINEPHrine 0.3 mg/0.3 mL IJ SOAJ injection  As needed     Discontinue  Reprint     05/02/20 1902    methocarbamol (ROBAXIN) 500 MG tablet  Every 12 hours PRN     Discontinue  Reprint     05/02/20 1902           Bishop Dublin 05/02/20 1902    Tegeler, Gwenyth Allegra, MD 05/03/20 [phone]

## 2020-05-03 LAB — URINE CULTURE: Culture: <10000 — AB

## 2023-03-05 ENCOUNTER — Emergency Department (HOSPITAL_COMMUNITY)
Admission: EM | Admit: 2023-03-05 | Discharge: 2023-03-05 | Disposition: A | Discharge status: Home / Self Care | Payer: 59 | Attending: Emergency Medicine | Admitting: Emergency Medicine

## 2023-03-05 ENCOUNTER — Other Ambulatory Visit: Payer: Self-pay

## 2023-03-05 DIAGNOSIS — E119 Type 2 diabetes mellitus without complications: Secondary | ICD-10-CM | POA: Diagnosis not present

## 2023-03-05 DIAGNOSIS — R4182 Altered mental status, unspecified: Secondary | ICD-10-CM | POA: Insufficient documentation

## 2023-03-05 NOTE — ED Notes (Signed)
Pt left AMA, MD notified.

## 2023-03-05 NOTE — ED Provider Notes (Signed)
[place] EMERGENCY DEPARTMENT AT [facility] Provider Note   CSN: [id number] Arrival date & time: 03/05/23  1333     History  No chief complaint on file.   [name] is a 47 y.o. female.  The history is provided by the EMS personnel, the patient and medical records. No language interpreter was used.  Altered Mental Status Presenting symptoms: partial responsiveness   Presenting symptoms: no confusion   Severity:  Moderate Most recent episode:  Today Episode history:  Unable to specify Timing:  Sporadic Progression:  Resolved Chronicity:  New Associated symptoms: no abdominal pain, no agitation, no difficulty breathing, no fever, no headaches, no light-headedness, no nausea, no palpitations, no rash, no seizures, no slurred speech, no vomiting and no weakness        Home Medications Prior to Admission medications   Medication Sig Start Date End Date Taking? Authorizing Provider  cephALEXin (KEFLEX) 500 MG capsule Take 1 capsule (500 mg total) by mouth 4 (four) times daily. 02/13/20   Jacalyn Lefevre, MD  EPINEPHrine 0.3 mg/0.3 mL IJ SOAJ injection Inject 0.3 mLs (0.3 mg total) into the muscle as needed for anaphylaxis. 05/02/20   Couture, Cortni S, PA-C  HYDROcodone-acetaminophen (NORCO/VICODIN) 5-325 MG tablet Take 1 tablet by mouth every 4 (four) hours as needed. 02/13/20   Jacalyn Lefevre, MD  ibuprofen (ADVIL,MOTRIN) 200 MG tablet Take 800 mg by mouth every 6 (six) hours as needed for mild pain.    [provider]  methocarbamol (ROBAXIN) 500 MG tablet Take 1 tablet (500 mg total) by mouth every 12 (twelve) hours as needed for muscle spasms. Do not drive, work, operate heavy machinery, or take care of children while taking this medication as it can be sedating. 05/02/20   Couture, Cortni S, PA-C  metroNIDAZOLE (FLAGYL) 500 MG tablet Take 1 tablet (500 mg total) by mouth 2 (two) times daily. Patient not taking: Reported on 11/28/2018 02/15/18   Hedges,  Tinnie Gens, PA-C  ondansetron (ZOFRAN) 4 MG tablet Take 1 tablet (4 mg total) by mouth every 8 (eight) hours as needed for nausea or vomiting. 11/28/18   Charlynne Pander, MD      Allergies    Keflex [cephalexin]    Review of Systems   Review of Systems  Constitutional:  Positive for fatigue. Negative for chills, diaphoresis and fever.  HENT:  Negative for congestion.   Eyes:  Negative for visual disturbance.  Respiratory:  Negative for cough, chest tightness, shortness of breath and wheezing.   Cardiovascular:  Negative for chest pain and palpitations.  Gastrointestinal:  Negative for abdominal pain, constipation, diarrhea, nausea and vomiting.  Genitourinary:  Negative for dysuria and flank pain.  Musculoskeletal:  Negative for back pain, neck pain and neck stiffness.  Skin:  Negative for rash and wound.  Neurological:  Negative for dizziness, seizures, weakness, light-headedness, numbness and headaches.  Psychiatric/Behavioral:  Positive for sleep disturbance. Negative for agitation and confusion.   All other systems reviewed and are negative.   Physical Exam Updated Vital Signs Temp 97.6 F (36.4 C) (Oral)   Ht 5\' 7"  (1.702 m)   Wt 81.6 kg   BMI 28.19 kg/m  Physical Exam Vitals and nursing note reviewed.  Constitutional:      General: She is not in acute distress.    Appearance: She is well-developed. She is not ill-appearing, toxic-appearing or diaphoretic.  HENT:     Head: Normocephalic and atraumatic.     Nose: No congestion or rhinorrhea.  Mouth/Throat:     Mouth: Mucous membranes are moist.     Pharynx: No oropharyngeal exudate or posterior oropharyngeal erythema.  Eyes:     Extraocular Movements: Extraocular movements intact.     Conjunctiva/sclera: Conjunctivae normal.     Pupils: Pupils are equal, round, and reactive to light.     Comments: Pupils are 2 mm and reactive.  They are symmetric.  Normal extract movements.  Cardiovascular:     Rate and Rhythm:  Normal rate and regular rhythm.     Pulses: Normal pulses.     Heart sounds: No murmur heard. Pulmonary:     Effort: Pulmonary effort is normal. No respiratory distress.     Breath sounds: Normal breath sounds. No wheezing, rhonchi or rales.  Chest:     Chest wall: No tenderness.  Abdominal:     General: Abdomen is flat.     Palpations: Abdomen is soft.     Tenderness: There is no abdominal tenderness. There is no right CVA tenderness, left CVA tenderness, guarding or rebound.  Musculoskeletal:        General: No swelling or tenderness.     Cervical back: Neck supple. No tenderness.     Right lower leg: No edema.     Left lower leg: No edema.  Skin:    General: Skin is warm and dry.     Capillary Refill: Capillary refill takes less than 2 seconds.     Findings: No erythema or rash.  Neurological:     General: No focal deficit present.     Mental Status: She is alert.     Sensory: No sensory deficit.     Motor: No weakness.  Psychiatric:        Mood and Affect: Mood normal.     ED Results / Procedures / Treatments   Labs (all labs ordered are listed, but only abnormal results are displayed) Labs Reviewed - No data to display  EKG EKG Interpretation  Date/Time:  Sunday March 05 2023 13:45:28 EDT Ventricular Rate:  86 PR Interval:  138 QRS Duration: 115 QT Interval:  365 QTC Calculation: 437 R Axis:   20 Text Interpretation: Sinus rhythm Incomplete right bundle branch block Artifact in lead(s) I III aVR aVL aVF when compared to prior, faster rate and more artifact. No STEMI Confirmed by Theda Belfast ([id number]) on 03/05/2023 1:53:48 PM  Radiology No results found.  Procedures Procedures    Medications Ordered in ED Medications - No data to display  ED Course/ Medical Decision Making/ A&P                             Medical Decision Making   [name] is a 47 y.o. female with a past medical history significant bipolar disorder, documentation of previous  opioid abuse, and diabetes who presents via EMS for further evaluation of transient altered mental status and transient hypoxia.  According to patient and EMS, she has not been sleeping well and then got up this morning to donate plasma over going to work.  She reports at work she has been very sleepy and just feels tired.  EMS said that she had some pinpoint pupils and was having some altered mental status.  She reportedly had oxygen saturations dipped in the 80s but on arrival she is off of oxygen and sats are in the 90s.  She is denying any fevers, chills, chest pain, shortness of breath, nausea,  vomiting, constipation, diarrhea, or urinary changes.  She denies any physical complaints and just feels tired.  She says that she needs to leave to go back to her motel room so she does not lose it.  She otherwise denies complaints.  On exam, lungs were clear and chest was nontender.  Abdomen was nontender.  Patient was resting.  Patient did intermittently doze off while getting her workup to the monitor but was arousable by voice.  When we discussed getting workup started, patient immediately said she wants to leave AGAINST MEDICAL ADVICE.  We discussed that with her transient hypoxia, she could have a pneumothorax, pneumonia, or other significant pathology that could lead to death however patient reports that she feels well and needs to be discharged.  She denies any opioid use or and denies any other substance use.  She denies any other complaints.  Patient will leave AGAINST MEDICAL ADVICE and will follow-up with primary doctor.  Patient did not have altered mental status when I was talking with her at the end of the evaluation and she also did not have hypoxia.  I feel she can make this decision and she will leave AGAINST MEDICAL ADVICE.         Final Clinical Impression(s) / ED Diagnoses Final diagnoses:  Altered mental status, unspecified altered mental status type     Clinical  Impression: 1. Altered mental status, unspecified altered mental status type     Disposition: Leaving AGAINST MEDICAL ADVICE  This note was prepared with assistance of Dragon voice recognition software. Occasional wrong-word or sound-a-like substitutions may have occurred due to the inherent limitations of voice recognition software.      Bradlee Heitman, Canary Brim, MD 03/05/23 [phone]

## 2023-03-05 NOTE — ED Triage Notes (Signed)
Pt BIB [facility] from work. 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 On 2L 02 due to 02 lowering when nodding off. Pt A&Ox4, drowsy, VSS. Pt gave plasma this morning and took benadryl 2 hrs before. Pt reports she didn't hit her head. Denies opoid use.

## 2023-03-05 NOTE — Discharge Instructions (Signed)
Your history and exam today led [place] to want to get labs and imaging to rule out things like pneumonia or other intrathoracic problem causing your intermittent low oxygen saturations and-year-old for mental status.  However you did not want to wait and want to leave AGAINST MEDICAL ADVICE.  Given your lack of hypoxia at this time, your understanding of your risk for worsening of condition including leading up to death, we feel you can make a decision to leave AGAINST MEDICAL ADVICE.  If any symptoms change or worsen, please return to the nearest emergency room.  Please follow-up with your primary doctor.
